# Patient Record
Sex: Male | Born: 1960 | Race: White | Hispanic: No | Marital: Married | Smoking: Never smoker
Health system: Southern US, Community
[De-identification: ages and names within clinical notes are randomized; demographics above are authoritative.]

## PROBLEM LIST (undated history)

## (undated) DIAGNOSIS — E119 Type 2 diabetes mellitus without complications: Secondary | ICD-10-CM

## (undated) DIAGNOSIS — E785 Hyperlipidemia, unspecified: Secondary | ICD-10-CM

## (undated) DIAGNOSIS — G5603 Carpal tunnel syndrome, bilateral upper limbs: Secondary | ICD-10-CM

## (undated) DIAGNOSIS — I10 Essential (primary) hypertension: Secondary | ICD-10-CM

## (undated) HISTORY — PX: TONSILLECTOMY: SUR1361

## (undated) HISTORY — DX: Carpal tunnel syndrome, bilateral upper limbs: G56.03

## (undated) HISTORY — DX: Hyperlipidemia, unspecified: E78.5

## (undated) HISTORY — DX: Essential (primary) hypertension: I10

---

## 2012-03-09 ENCOUNTER — Encounter (HOSPITAL_BASED_OUTPATIENT_CLINIC_OR_DEPARTMENT_OTHER): Payer: Self-pay | Admitting: *Deleted

## 2012-03-09 ENCOUNTER — Emergency Department (HOSPITAL_BASED_OUTPATIENT_CLINIC_OR_DEPARTMENT_OTHER)
Admission: EM | Admit: 2012-03-09 | Discharge: 2012-03-10 | Disposition: A | Discharge status: Home / Self Care | Payer: BC Managed Care – PPO | Attending: Emergency Medicine | Admitting: Emergency Medicine

## 2012-03-09 DIAGNOSIS — IMO0001 Reserved for inherently not codable concepts without codable children: Secondary | ICD-10-CM | POA: Insufficient documentation

## 2012-03-09 DIAGNOSIS — Z79899 Other long term (current) drug therapy: Secondary | ICD-10-CM | POA: Insufficient documentation

## 2012-03-09 DIAGNOSIS — E119 Type 2 diabetes mellitus without complications: Secondary | ICD-10-CM | POA: Insufficient documentation

## 2012-03-09 DIAGNOSIS — J111 Influenza due to unidentified influenza virus with other respiratory manifestations: Secondary | ICD-10-CM

## 2012-03-09 DIAGNOSIS — R05 Cough: Secondary | ICD-10-CM | POA: Insufficient documentation

## 2012-03-09 DIAGNOSIS — R059 Cough, unspecified: Secondary | ICD-10-CM | POA: Insufficient documentation

## 2012-03-09 HISTORY — DX: Type 2 diabetes mellitus without complications: E11.9

## 2012-03-09 LAB — URINE MICROSCOPIC-ADD ON

## 2012-03-09 LAB — URINALYSIS, ROUTINE W REFLEX MICROSCOPIC
Bilirubin Urine: NEGATIVE
Glucose, UA: NEGATIVE mg/dL
Hgb urine dipstick: NEGATIVE
Ketones, ur: NEGATIVE mg/dL
Leukocytes, UA: NEGATIVE
Nitrite: NEGATIVE
Protein, ur: 30 mg/dL — AB
Specific Gravity, Urine: 1.028 (ref 1.005–1.030)
Urobilinogen, UA: 1 mg/dL (ref 0.0–1.0)
pH: 5.5 (ref 5.0–8.0)

## 2012-03-09 MED ORDER — HYDROCOD POLST-CHLORPHEN POLST 10-8 MG/5ML PO LQCR: 5.0000 mL | Freq: Two times a day (BID) | ORAL | Status: DC | PRN

## 2012-03-09 MED ORDER — OSELTAMIVIR PHOSPHATE 75 MG PO CAPS: 75.0000 mg | ORAL_CAPSULE | Freq: Two times a day (BID) | ORAL | Status: DC

## 2012-03-09 NOTE — ED Notes (Addendum)
Cough, congestion, fever onset Thursday. Wife here with similar s/s. Took Ibuprofen 600 mg 1 hour ago. Also c/o pressure and frequency with urination

## 2012-03-09 NOTE — ED Provider Notes (Signed)
History  This chart was scribed for Hanley Seamen, MD by Shari Heritage, ED Scribe. The patient was seen in room MH04/MH04. Patient's care was started at 2334.  CSN: [id number]  Arrival date & time 03/09/12  2231   First MD Initiated Contact with Patient 03/09/12 2334      Chief Complaint  Patient presents with  . Flu-Like Symptoms     The history is provided by the patient. No language interpreter was used.    HPI Comments: Jon Archer is a 52 y.o. male with history of diabetes who presents to the Emergency Department complaining of fever, chills and body aches that began in the last 1-2 days. Tmax at home was 102.4. There is associated neck and lower back soreness, sore throat, cough and intermittent congestion. Patient denies nausea, vomiting, diarrhea or shortness of breath. Patient also complains of bladder pressure and increased urinary frequency. Patient says that he is experiencing the most discomfort from fever and back aches. Patient's wife also here with flu-like symptoms. Patient did not have a flu shot this season. He does not smoke.  Past Medical History  Diagnosis Date  . Diabetes mellitus without complication     History reviewed. No pertinent past surgical history.  History reviewed. No pertinent family history.  History  Substance Use Topics  . Smoking status: Never Smoker   . Smokeless tobacco: Not on file  . Alcohol Use: No      Review of Systems A complete 10 system review of systems was obtained and all systems are negative except as noted in the HPI and PMH.   Allergies  Peanuts  Home Medications   Current Outpatient Rx  Name  Route  Sig  Dispense  Refill  . GLYBURIDE 2.5 MG PO TABS   Oral   Take 2.5 mg by mouth daily with breakfast.         . METFORMIN HCL 1000 MG PO TABS   Oral   Take 1,000 mg by mouth 2 (two) times daily with a meal.           Triage Vitals: BP 141/89  Pulse 108  Temp 101.8 F (38.8 C) (Oral)  Resp 20  Ht 5'  9.5" (1.765 m)  Wt 270 lb (122.471 kg)  BMI 39.30 kg/m2  SpO2 94%  Physical Exam  Constitutional: He is oriented to person, place, and time. He appears well-developed and well-nourished. No distress.  HENT:  Head: Normocephalic and atraumatic.  Mouth/Throat: Oropharynx is clear and moist and mucous membranes are normal. No oropharyngeal exudate or posterior oropharyngeal erythema.  Eyes: Conjunctivae normal and EOM are normal. Pupils are equal, round, and reactive to light.  Neck: Neck supple.  Cardiovascular: Normal rate and regular rhythm.   No murmur heard. Pulmonary/Chest: Effort normal and breath sounds normal. No respiratory distress. He has no wheezes. He has no rales.       Occasional coughs.  Abdominal: Soft. Bowel sounds are normal.  Musculoskeletal: Normal range of motion. He exhibits no edema.       Normal pulses. No edema.  Neurological: He is alert and oriented to person, place, and time.  Skin: Skin is warm and dry.  Psychiatric: He has a normal mood and affect. His behavior is normal.    ED Course  Procedures (including critical care time) DIAGNOSTIC STUDIES: Oxygen Saturation is 94% on room air, adequate by my interpretation.    COORDINATION OF CARE: 11:38 PM- Patient informed of current plan for treatment and  evaluation and agrees with plan at this time.     MDM   Nursing notes and vitals signs, including pulse oximetry, reviewed.  Summary of this visit's results, reviewed by myself:  Labs:  Results for orders placed during the hospital encounter of 03/09/12 (from the past 24 hour(s))  URINALYSIS, ROUTINE W REFLEX MICROSCOPIC     Status: Abnormal   Collection Time   03/09/12 10:47 PM      Component Value Range   Color, Urine YELLOW  YELLOW   APPearance CLEAR  CLEAR   Specific Gravity, Urine 1.028  1.005 - 1.030   pH 5.5  5.0 - 8.0   Glucose, UA NEGATIVE  NEGATIVE mg/dL   Hgb urine dipstick NEGATIVE  NEGATIVE   Bilirubin Urine NEGATIVE  NEGATIVE    Ketones, ur NEGATIVE  NEGATIVE mg/dL   Protein, ur 30 (*) NEGATIVE mg/dL   Urobilinogen, UA 1.0  0.0 - 1.0 mg/dL   Nitrite NEGATIVE  NEGATIVE   Leukocytes, UA NEGATIVE  NEGATIVE  URINE MICROSCOPIC-ADD ON     Status: Abnormal   Collection Time   03/09/12 10:47 PM      Component Value Range   Squamous Epithelial / LPF FEW (*) RARE   WBC, UA 0-2  <3 WBC/hpf   RBC / HPF 0-2  <3 RBC/hpf   Casts GRANULAR CAST (*) NEGATIVE   Urine-Other MUCOUS PRESENT              I personally performed the services described in this documentation, which was scribed in my presence.  The recorded information has been reviewed and is accurate.Carlisle Beers Shalene Gallen, MD 03/09/12 [phone]

## 2012-04-12 ENCOUNTER — Emergency Department (HOSPITAL_BASED_OUTPATIENT_CLINIC_OR_DEPARTMENT_OTHER)
Admission: EM | Admit: 2012-04-12 | Discharge: 2012-04-12 | Disposition: A | Discharge status: Home / Self Care | Payer: BC Managed Care – PPO | Attending: Emergency Medicine | Admitting: Emergency Medicine

## 2012-04-12 ENCOUNTER — Encounter (HOSPITAL_BASED_OUTPATIENT_CLINIC_OR_DEPARTMENT_OTHER): Payer: Self-pay | Admitting: *Deleted

## 2012-04-12 ENCOUNTER — Emergency Department (HOSPITAL_BASED_OUTPATIENT_CLINIC_OR_DEPARTMENT_OTHER): Payer: BC Managed Care – PPO

## 2012-04-12 DIAGNOSIS — S9030XA Contusion of unspecified foot, initial encounter: Secondary | ICD-10-CM | POA: Insufficient documentation

## 2012-04-12 DIAGNOSIS — E119 Type 2 diabetes mellitus without complications: Secondary | ICD-10-CM | POA: Insufficient documentation

## 2012-04-12 DIAGNOSIS — W1789XA Other fall from one level to another, initial encounter: Secondary | ICD-10-CM | POA: Insufficient documentation

## 2012-04-12 DIAGNOSIS — Y9389 Activity, other specified: Secondary | ICD-10-CM | POA: Insufficient documentation

## 2012-04-12 DIAGNOSIS — S300XXA Contusion of lower back and pelvis, initial encounter: Secondary | ICD-10-CM | POA: Insufficient documentation

## 2012-04-12 DIAGNOSIS — Z79899 Other long term (current) drug therapy: Secondary | ICD-10-CM | POA: Insufficient documentation

## 2012-04-12 DIAGNOSIS — Y9289 Other specified places as the place of occurrence of the external cause: Secondary | ICD-10-CM | POA: Insufficient documentation

## 2012-04-12 MED ORDER — HYDROCODONE-ACETAMINOPHEN 5-325 MG PO TABS: 2.0000 | ORAL_TABLET | Freq: Four times a day (QID) | ORAL | Status: DC | PRN

## 2012-04-12 NOTE — ED Notes (Signed)
Left hand and elbow appear to be swelling

## 2012-04-12 NOTE — ED Notes (Signed)
Fell through ceiling to floor about 12 feet states he landed on left foot no loss of consciousness tail bone hurts low back spasms

## 2012-04-12 NOTE — ED Notes (Signed)
Pt also reports sacral pain that increases with movement.  Pt states he fell through a ceiling approx 12 feet, landing on left foot and back.  Denies head injury, LOC or neck pain. MAE WNL.  Abrasions noted to upper extremities.

## 2012-04-12 NOTE — ED Provider Notes (Signed)
History     CSN: [id number]  Arrival date & time 04/12/12  1725   First MD Initiated Contact with Patient 04/12/12 1931      Chief Complaint  Patient presents with  . Foot Pain  . Fall    (Consider location/radiation/quality/duration/timing/severity/associated sxs/prior treatment) Patient is a 52 y.o. male presenting with lower extremity pain and fall.  Foot Pain  Fall   Pt was working in a crawl space today when he fell through the ceiling and onto the floor below landing on his L foot and then on his buttocks. Complaining of moderate to severe aching pain in L foot, worse with movement and bearing weight. He also has moderate aching sacral pain. Denies any head injury or LOC.   Past Medical History  Diagnosis Date  . Diabetes mellitus without complication     History reviewed. No pertinent past surgical history.  History reviewed. No pertinent family history.  History  Substance Use Topics  . Smoking status: Never Smoker   . Smokeless tobacco: Not on file  . Alcohol Use: No      Review of Systems All other systems reviewed and are negative except as noted in HPI.   Allergies  Peanuts  Home Medications   Current Outpatient Rx  Name  Route  Sig  Dispense  Refill  . chlorpheniramine-HYDROcodone (TUSSIONEX PENNKINETIC ER) 10-8 MG/5ML LQCR   Oral   Take 5 mLs by mouth every 12 (twelve) hours as needed (for cough).   115 mL   0   . glyBURIDE (DIABETA) 2.5 MG tablet   Oral   Take 2.5 mg by mouth daily with breakfast.         . metFORMIN (GLUCOPHAGE) 1000 MG tablet   Oral   Take 1,000 mg by mouth 2 (two) times daily with a meal.         . oseltamivir (TAMIFLU) 75 MG capsule   Oral   Take 1 capsule (75 mg total) by mouth every 12 (twelve) hours.   10 capsule   0     BP 151/90  Pulse 98  Temp(Src) 98.1 F (36.7 C) (Oral)  Resp 18  Ht 5' 9.5" (1.765 m)  Wt 275 lb (124.739 kg)  BMI 40.04 kg/m2  SpO2 100%  Physical Exam  Nursing note and  vitals reviewed. Constitutional: He is oriented to person, place, and time. He appears well-developed and well-nourished.  HENT:  Head: Normocephalic and atraumatic.  Eyes: EOM are normal. Pupils are equal, round, and reactive to light.  Neck: Normal range of motion. Neck supple.  Cardiovascular: Normal rate, normal heart sounds and intact distal pulses.   Pulmonary/Chest: Effort normal and breath sounds normal.  Abdominal: Bowel sounds are normal. He exhibits no distension. There is no tenderness.  Musculoskeletal: He exhibits tenderness (tenderness and ecchymosis to L foot distal to lateral malleolus and on the plantar surface, no bony tenderness). He exhibits no edema.  Moderate sacral tenderness  Neurological: He is alert and oriented to person, place, and time. He has normal strength. No cranial nerve deficit or sensory deficit.  Skin: Skin is warm and dry. No rash noted.  Psychiatric: He has a normal mood and affect.    ED Course  Procedures (including critical care time)  Labs Reviewed - No data to display Dg Lumbar Spine Complete  04/12/2012  *RADIOLOGY REPORT*  Clinical Data: Fall, low back pain.  LUMBAR SPINE - COMPLETE 4+ VIEW  Comparison: None.  Findings: Degenerative disc and facet  disease throughout the lumbar spine, most pronounced at the lumbosacral junction.  Severe bony overgrowth and L5-S1 facets.  Normal alignment.  SI joints are symmetric and unremarkable.  IMPRESSION: Degenerative disc and facet disease, most pronounced in the lower lumbar spine.  No acute findings.   Original Report Authenticated By: Charlett Nose, M.D.    Dg Foot Complete Left  04/12/2012  *RADIOLOGY REPORT*  Clinical Data: Fall, foot pain.  LEFT FOOT - COMPLETE 3+ VIEW  Comparison: None.  Findings: Plantar calcaneal spur. No acute bony abnormality. Specifically, no fracture, subluxation, or dislocation.  Soft tissues are intact.  Degenerative changes in the left ankle.  IMPRESSION: No acute bony  abnormality.   Original Report Authenticated By: Charlett Nose, M.D.      No diagnosis found.    MDM  Xrays neg for fracture. Will give Cam Walker, crutches, pain medications as needed and advised to stay off foot to facilitate healing.         Jamieson Hetland B. Bernette Mayers, MD 04/12/12 2030

## 2012-04-12 NOTE — Discharge Instructions (Signed)

## 2015-11-23 ENCOUNTER — Telehealth: Payer: Self-pay | Admitting: Behavioral Health

## 2015-11-23 NOTE — Telephone Encounter (Signed)
Unable to reach patient at time of Pre-Visit Call.  Left message for patient to return call when available.    

## 2015-11-24 ENCOUNTER — Ambulatory Visit (INDEPENDENT_AMBULATORY_CARE_PROVIDER_SITE_OTHER): Payer: BLUE CROSS/BLUE SHIELD | Admitting: Family Medicine

## 2015-11-24 ENCOUNTER — Encounter: Payer: Self-pay | Admitting: Family Medicine

## 2015-11-24 VITALS — BP 153/93 | HR 80 | Temp 98.6°F | Ht 69.5 in | Wt 268.2 lb

## 2015-11-24 DIAGNOSIS — Z13220 Encounter for screening for lipoid disorders: Secondary | ICD-10-CM | POA: Diagnosis not present

## 2015-11-24 DIAGNOSIS — I10 Essential (primary) hypertension: Secondary | ICD-10-CM | POA: Diagnosis not present

## 2015-11-24 DIAGNOSIS — Z130 Encounter for screening for diseases of the blood and blood-forming organs and certain disorders involving the immune mechanism: Secondary | ICD-10-CM

## 2015-11-24 DIAGNOSIS — E669 Obesity, unspecified: Secondary | ICD-10-CM

## 2015-11-24 DIAGNOSIS — E119 Type 2 diabetes mellitus without complications: Secondary | ICD-10-CM

## 2015-11-24 DIAGNOSIS — J989 Respiratory disorder, unspecified: Secondary | ICD-10-CM

## 2015-11-24 DIAGNOSIS — R0989 Other specified symptoms and signs involving the circulatory and respiratory systems: Secondary | ICD-10-CM

## 2015-11-24 DIAGNOSIS — G5603 Carpal tunnel syndrome, bilateral upper limbs: Secondary | ICD-10-CM

## 2015-11-24 LAB — COMPREHENSIVE METABOLIC PANEL
ALT: 31 U/L (ref 0–53)
AST: 22 U/L (ref 0–37)
Albumin: 4.2 g/dL (ref 3.5–5.2)
Alkaline Phosphatase: 94 U/L (ref 39–117)
BUN: 20 mg/dL (ref 6–23)
CO2: 30 mEq/L (ref 19–32)
Calcium: 9.2 mg/dL (ref 8.4–10.5)
Chloride: 102 mEq/L (ref 96–112)
Creatinine, Ser: 0.8 mg/dL (ref 0.40–1.50)
GFR: 106.62 mL/min (ref >60.00)
Glucose, Bld: 171 mg/dL — ABNORMAL HIGH (ref 70–99)
Potassium: 4.1 mEq/L (ref 3.5–5.1)
Sodium: 139 mEq/L (ref 135–145)
Total Bilirubin: 0.5 mg/dL (ref 0.2–1.2)
Total Protein: 6.8 g/dL (ref 6.0–8.3)

## 2015-11-24 LAB — LIPID PANEL
Cholesterol: 202 mg/dL — ABNORMAL HIGH (ref 0–200)
HDL: 37.6 mg/dL — ABNORMAL LOW (ref >39.00)
LDL Cholesterol: 140 mg/dL — ABNORMAL HIGH (ref 0–99)
NonHDL: 164.52
Total CHOL/HDL Ratio: 5
Triglycerides: 125 mg/dL (ref 0.0–149.0)
VLDL: 25 mg/dL (ref 0.0–40.0)

## 2015-11-24 LAB — CBC
HCT: 44.6 % (ref 39.0–52.0)
Hemoglobin: 15.3 g/dL (ref 13.0–17.0)
MCHC: 34.3 g/dL (ref 30.0–36.0)
MCV: 79.8 fl (ref 78.0–100.0)
Platelets: 203 10*3/uL (ref 150.0–400.0)
RBC: 5.59 Mil/uL (ref 4.22–5.81)
RDW: 13.9 % (ref 11.5–15.5)
WBC: 7.5 10*3/uL (ref 4.0–10.5)

## 2015-11-24 LAB — HEMOGLOBIN A1C: Hgb A1c MFr Bld: 8.5 % — ABNORMAL HIGH (ref 4.6–6.5)

## 2015-11-24 MED ORDER — LISINOPRIL 10 MG PO TABS: 10.0000 mg | ORAL_TABLET | Freq: Every day | ORAL | 3 refills | Status: DC

## 2015-11-24 MED ORDER — METFORMIN HCL 1000 MG PO TABS: 1000.0000 mg | ORAL_TABLET | Freq: Two times a day (BID) | ORAL | 3 refills | Status: DC

## 2015-11-24 MED ORDER — ALBUTEROL SULFATE HFA 108 (90 BASE) MCG/ACT IN AERS: 2.0000 | INHALATION_SPRAY | Freq: Four times a day (QID) | RESPIRATORY_TRACT | 0 refills | Status: DC | PRN

## 2015-11-24 MED ORDER — GLIMEPIRIDE 2 MG PO TABS: 2.0000 mg | ORAL_TABLET | Freq: Two times a day (BID) | ORAL | 3 refills | Status: DC

## 2015-11-24 NOTE — Progress Notes (Addendum)
[facility] [address], [address] [place], [place] [address] [phone]  Date:  11/24/2015   Name:  Jon Archer   DOB:  December 04, 1960   MRN:  [id number]  PCP:  Abbe Amsterdam, MD    Chief Complaint: Establish Care (Pt here to est care and will need med refills.)   History of Present Illness:  Jon Archer is a 55 y.o. very pleasant male patient who presents with the following:  Here today as a new patient to establish care and have medication refills  History of DM II on oral medication  He is feeling well today He was dx with DM approx 3-4 years ago.  He uses metformin and also glimepiride.  His last A1c was over a year ago.  Admits he has not kept up with screening as well as he should   He has not wanted to take BP meds in the past.  He will check his BP at home- it may run 150s/ 80-90.  He is realizing he probably does need to start BP medication  He does AV installations, mostly commercial.    He is married, they have one step son who is grown and independent. He does not have a lot of free time, he does work a lot.    He did have a right knee meniscal tear but did not have surgery- it does not generally bother him that much currently and he is not considering surgery at this time.    He will sometimes notice mild wheezing when he is exposed to allergens or change in air temperature.  Never formally dx with asthma.  No exercise sx, no chest pain or heaviness with exercise.  He uses OTC zyrtec daily  Colonoscopy UTD, 2014, done through [facility]  He declines PSA testing today He declies a flu shot today  He has been told he might have CTS in the past.  The sx will travel between both hands. He does have braces for sleep, but they are somewhat umcomfortable so he does not always use them.  However they do seem to help when he does use them.  He will notice tingling, some weakness in the index and long fingers.  Can be worse in the mornings. No other neurological sx.  Never had nerve conduction studies but he does not want to have these as he is not at the point of considering surgery at this time   BP Readings from Last 3 Encounters:  11/24/15 (!) 153/93  04/12/12 151/90  03/09/12 141/89    There are no active problems to display for this patient.   Past Medical History:  Diagnosis Date  . Diabetes mellitus without complication     No past surgical history on file.  Social History  Substance Use Topics  . Smoking status: Never Smoker  . Smokeless tobacco: Not on file  . Alcohol use No    No family history on file.  Allergies  Allergen Reactions  . Peanuts [Peanut Oil] Itching    Medication list has been reviewed and updated.  Current Outpatient Prescriptions on File Prior to Visit  Medication Sig Dispense Refill  . metFORMIN (GLUCOPHAGE) 1000 MG tablet Take 1,000 mg by mouth 2 (two) times daily with a meal.     No current facility-administered medications on file prior to visit.     Review of Systems:  As per HPI- otherwise negative. No fever, chills, nausea, vomiting, or diarrhea,  no rash   Physical Examination: Blood pressure (!) 153/93, pulse 80, temperature 98.6 F (37 C), temperature source Oral, height 5' 9.5" (1.765 m), weight 268 lb 3.2 oz (121.7 kg), SpO2 100 %. Body mass index is 39.04 kg/m.  Ideal Body Weight:    GEN: WDWN, NAD, Non-toxic, A & O x 3, obese, otherwise looks well HEENT: Atraumatic, Normocephalic. Neck supple. No masses, No LAD.  Bilateral TM wnl, oropharynx normal.  PEERL,EOMI.   Ears and Nose: No external deformity. CV: RRR, No M/G/R. No JVD. No thrill. No extra heart sounds. PULM: CTA B, no wheezes, crackles, rhonchi. No retractions. No resp. distress. No accessory muscle use. ABD: S, NT, ND EXTR: No c/c/e NEURO Normal gait.  PSYCH: Normally interactive. Conversant. Not depressed or anxious appearing.  Calm demeanor.  Not able to  reproduce his CTS symptoms on exam   Assessment and Plan: Controlled type 2 diabetes mellitus without complication, without long-term current use of insulin (HCC) - Plan: glimepiride (AMARYL) 2 MG tablet, metFORMIN (GLUCOPHAGE) 1000 MG tablet, Comprehensive metabolic panel, Hemoglobin A1c, Urine Microalbumin w/creat. ratio  Essential hypertension, benign - Plan: lisinopril (PRINIVIL,ZESTRIL) 10 MG tablet, Comprehensive metabolic panel  Screening for deficiency anemia - Plan: CBC  Screening for hyperlipidemia - Plan: Lipid panel  Reactive airway disease that is not asthma - Plan: albuterol (PROVENTIL HFA;VENTOLIN HFA) 108 (90 Base) MCG/ACT inhaler  Bilateral carpal tunnel syndrome  Obesity, unspecified  Here today to establish care Refilled his medications as above Await labs to eval his current DM control Encouraged him to use his wrist braces for CTS symptoms rx for albuterol to use as needed for RAD symptoms.  Will plan further follow- up pending labs. Start lisinopril for his BP   Signed Abbe AmsterdamJessica Kainen Struckman, MD  Received his labs on 9/28- gave him a call.  He is not willing to use a statin.  In that case encouraged him to try and increase his good fats and use a fish oil/ omega 3 supplement to boost HDL.  A1c is also too high but we will work on lifestyle and diet prior to changing meds.  Recheck 3-4 months   Results for orders placed or performed in visit on 11/24/15  CBC  Result Value Ref Range   WBC 7.5 4.0 - 10.5 K/uL   RBC 5.59 4.22 - 5.81 Mil/uL   Platelets 203.0 150.0 - 400.0 K/uL   Hemoglobin 15.3 13.0 - 17.0 g/dL   HCT 40.944.6 81.139.0 - 91.452.0 %   MCV 79.8 78.0 - 100.0 fl   MCHC 34.3 30.0 - 36.0 g/dL   RDW 78.213.9 95.611.5 - 21.315.5 %  Comprehensive metabolic panel  Result Value Ref Range   Sodium 139 135 - 145 mEq/L   Potassium 4.1 3.5 - 5.1 mEq/L   Chloride 102 96 - 112 mEq/L   CO2 30 19 - 32 mEq/L   Glucose, Bld 171 (H) 70 - 99 mg/dL   BUN 20 6 - 23 mg/dL   Creatinine, Ser  0.860.80 0.40 - 1.50 mg/dL   Total Bilirubin 0.5 0.2 - 1.2 mg/dL   Alkaline Phosphatase 94 39 - 117 U/L   AST 22 0 - 37 U/L   ALT 31 0 - 53 U/L   Total Protein 6.8 6.0 - 8.3 g/dL   Albumin 4.2 3.5 - 5.2 g/dL   Calcium 9.2 8.4 - 57.810.5 mg/dL   GFR 469.62106.62 >95.28>60.00 mL/min  Lipid panel  Result Value Ref Range   Cholesterol 202 (H) 0 -  200 mg/dL   Triglycerides 161.0 0.0 - 149.0 mg/dL   HDL 96.04 (L) >54.09 mg/dL   VLDL 81.1 0.0 - 91.4 mg/dL   LDL Cholesterol 782 (H) 0 - 99 mg/dL   Total CHOL/HDL Ratio 5    NonHDL 164.52   Hemoglobin A1c  Result Value Ref Range   Hgb A1c MFr Bld 8.5 (H) 4.6 - 6.5 %  Urine Microalbumin w/creat. ratio  Result Value Ref Range   Microalb, Ur 1.1 0.0 - 1.9 mg/dL   Creatinine,U 956.2 mg/dL   Microalb Creat Ratio 0.9 0.0 - 30.0 mg/g

## 2015-11-24 NOTE — Progress Notes (Signed)
Pre visit review using our clinic review tool, if applicable. No additional management support is needed unless otherwise documented below in the visit note. 

## 2015-11-24 NOTE — Patient Instructions (Addendum)
It was very nice to meet you today- I'll be in touch with your labs and we can then plan your next visit Please start on the lisinopril 10 mg once a day;this will help with your blood pressure and also protect your kidneys from damage related to diabetes. We will also look for any protein in your urine- this can be a sign of diabetes related damage It does sound like you have carpal tunnel syndrome.  For definitive diagnosis we would need to nerve conduction studies.  However, if you are not considering surgery there is no acute need to do the nerve studies.  Try to wear your wrist braces at night to off load the median nerve and help with your hand symptoms.

## 2015-11-25 ENCOUNTER — Encounter: Payer: Self-pay | Admitting: Family Medicine

## 2015-11-25 LAB — MICROALBUMIN / CREATININE URINE RATIO
Creatinine,U: 123.9 mg/dL
Microalb Creat Ratio: 0.9 mg/g (ref 0.0–30.0)
Microalb, Ur: 1.1 mg/dL (ref 0.0–1.9)

## 2016-02-15 ENCOUNTER — Encounter: Payer: Self-pay | Admitting: Internal Medicine

## 2016-02-15 ENCOUNTER — Other Ambulatory Visit: Payer: Self-pay | Admitting: Family Medicine

## 2016-02-15 ENCOUNTER — Ambulatory Visit (INDEPENDENT_AMBULATORY_CARE_PROVIDER_SITE_OTHER): Payer: BLUE CROSS/BLUE SHIELD | Admitting: Internal Medicine

## 2016-02-15 VITALS — BP 132/84 | HR 72 | Temp 98.4°F | Resp 14 | Ht 69.5 in | Wt 272.0 lb

## 2016-02-15 DIAGNOSIS — J989 Respiratory disorder, unspecified: Secondary | ICD-10-CM

## 2016-02-15 DIAGNOSIS — J209 Acute bronchitis, unspecified: Secondary | ICD-10-CM | POA: Diagnosis not present

## 2016-02-15 DIAGNOSIS — R0989 Other specified symptoms and signs involving the circulatory and respiratory systems: Secondary | ICD-10-CM

## 2016-02-15 MED ORDER — AZITHROMYCIN 250 MG PO TABS: ORAL_TABLET | ORAL | 0 refills | Status: DC

## 2016-02-15 NOTE — Patient Instructions (Addendum)
Rest, fluids , tylenol  For cough:  Take Mucinex DM twice a day as needed until better  For nasal congestion: Use OTC Nasocort or Flonase : 2 nasal sprays on each side of the nose in the morning until you feel better   Avoid decongestants such as  Pseudoephedrine or phenylephrine   If you have a persisting cough: Use albuterol 2 sprays every 6 hours.  Take the antibiotic as prescribed  (Zithromax) only if not improving in the next few days  Call if not gradually better over the next  10 days  Call anytime if the symptoms are severe  If you have severe "chest tightness" or being in the chest: Call the office or go to the ER

## 2016-02-15 NOTE — Progress Notes (Signed)
Subjective:    Patient ID: Jon Archer, male    DOB: 03/24/1960, 55 y.o.   MRN: [id number]  DOS:  02/15/2016 Type of visit - description : acute Interval history: Symptoms started 2 weeks ago with sinus congestion, coughing in the mornings with mild sputum. Last night, had a severe episode of cough. Does not have asthma but apparently reactive airway disease, has a prescription for albuterol; has not been using it lately.  Review of Systems  Denies fever chills Some postnasal dripping and "tickle in the throat" that make him cough. No chest pain but since the symptoms started he has some chest tightness with exertion although he has hear no wheezing per se. No myalgias. No nausea or vomiting although last week had one episode of diarrhea.  Past Medical History:  Diagnosis Date  . Carpal tunnel syndrome, bilateral   . Diabetes mellitus without complication (HCC)   . Hyperlipidemia   . Hypertension     Past Surgical History:  Procedure Laterality Date  . TONSILLECTOMY      Social History   Social History  . Marital status: Married    Spouse name: N/A  . Number of children: N/A  . Years of education: N/A   Occupational History  . Not on file.   Social History Main Topics  . Smoking status: Never Smoker  . Smokeless tobacco: Not on file  . Alcohol use No  . Drug use: No  . Sexual activity: Not on file   Other Topics Concern  . Not on file   Social History Narrative  . No narrative on file      Allergies as of 02/15/2016      Reactions   Peanuts [peanut Oil] Itching   Meloxicam Nausea Only      Medication List       Accurate as of 02/15/16 11:59 PM. Always use your most recent med list.          albuterol 108 (90 Base) MCG/ACT inhaler Commonly known as:  PROVENTIL HFA;VENTOLIN HFA Inhale 2 puffs into the lungs every 6 (six) hours as needed for wheezing or shortness of breath.   aspirin 81 MG tablet Take 81 mg by mouth daily.     azithromycin 250 MG tablet Commonly known as:  ZITHROMAX Z-PAK 2 tabs a day the first day, then 1 tab a day x 4 days   cetirizine 10 MG tablet Commonly known as:  ZYRTEC Take 10 mg by mouth daily.   CINNAMON PO Take 2 tablets by mouth 2 (two) times daily.   glimepiride 2 MG tablet Commonly known as:  AMARYL Take 1 tablet (2 mg total) by mouth 2 (two) times daily.   GLUCOMANNAN PO Take 2 tablets by mouth 2 (two) times daily.   lisinopril 10 MG tablet Commonly known as:  PRINIVIL,ZESTRIL Take 1 tablet (10 mg total) by mouth daily.   metFORMIN 1000 MG tablet Commonly known as:  GLUCOPHAGE Take 1 tablet (1,000 mg total) by mouth 2 (two) times daily with a meal.   MUCUS RELIEF ER PO Take 1,200 mg by mouth 2 (two) times daily as needed.          Objective:   Physical Exam BP 132/84 (BP Location: Left Arm, Patient Position: Sitting, Cuff Size: Normal)   Pulse 72   Temp 98.4 F (36.9 C) (Oral)   Resp 14   Ht 5' 9.5" (1.765 m)   Wt 272 lb (123.4 kg)   SpO2 96%  BMI 39.59 kg/m  General:   Well developed, well nourished . NAD.  HEENT:  Normocephalic . Face symmetric, atraumatic. Nose is slightly congested, sinuses no TTP. Throat symmetric, no red. Lungs:  CTA B Normal respiratory effort, no intercostal retractions, no accessory muscle use. Heart: RRR,  no murmur.  No pretibial edema bilaterally  Skin: Not pale. Not jaundice Neurologic:  alert & oriented X3.  Speech normal, gait appropriate for age and unassisted Psych--  Cognition and judgment appear intact.  Cooperative with normal attention span and concentration.  Behavior appropriate. No anxious or depressed appearing.      Assessment & Plan:   55 year old gentleman with history of diabetes, not a smoker, no asthma but reactive airway disease (has a albuterol prescription) presents with the following: Bronchitis: 2 weeks history of respiratory symptoms, will treat conservatively, if not better start  Z-Pak. Chest tightness: As described above, in the context of bronchitis and upper respiratory symptoms not likely to be something serious like angina. Recommend to call go to the ER if he has severe tightness or actual chest pain.

## 2016-02-15 NOTE — Progress Notes (Signed)
Pre visit review using our clinic review tool, if applicable. No additional management support is needed unless otherwise documented below in the visit note. 

## 2016-02-16 ENCOUNTER — Other Ambulatory Visit: Payer: Self-pay | Admitting: Emergency Medicine

## 2016-02-17 ENCOUNTER — Other Ambulatory Visit: Payer: Self-pay | Admitting: Emergency Medicine

## 2016-02-17 DIAGNOSIS — R0989 Other specified symptoms and signs involving the circulatory and respiratory systems: Secondary | ICD-10-CM

## 2016-02-17 DIAGNOSIS — J989 Respiratory disorder, unspecified: Secondary | ICD-10-CM

## 2016-02-17 MED ORDER — ALBUTEROL SULFATE HFA 108 (90 BASE) MCG/ACT IN AERS: 2.0000 | INHALATION_SPRAY | Freq: Four times a day (QID) | RESPIRATORY_TRACT | 0 refills | Status: DC | PRN

## 2016-03-17 ENCOUNTER — Emergency Department (INDEPENDENT_AMBULATORY_CARE_PROVIDER_SITE_OTHER): Payer: BLUE CROSS/BLUE SHIELD

## 2016-03-17 ENCOUNTER — Encounter: Payer: Self-pay | Admitting: Emergency Medicine

## 2016-03-17 ENCOUNTER — Emergency Department (INDEPENDENT_AMBULATORY_CARE_PROVIDER_SITE_OTHER)
Admission: EM | Admit: 2016-03-17 | Discharge: 2016-03-17 | Disposition: A | Discharge status: Home / Self Care | Payer: BLUE CROSS/BLUE SHIELD | Source: Home / Self Care | Attending: Family Medicine | Admitting: Family Medicine

## 2016-03-17 DIAGNOSIS — J9811 Atelectasis: Secondary | ICD-10-CM

## 2016-03-17 DIAGNOSIS — J209 Acute bronchitis, unspecified: Secondary | ICD-10-CM | POA: Diagnosis not present

## 2016-03-17 DIAGNOSIS — J019 Acute sinusitis, unspecified: Secondary | ICD-10-CM

## 2016-03-17 MED ORDER — BENZONATATE 100 MG PO CAPS: 100.0000 mg | ORAL_CAPSULE | Freq: Three times a day (TID) | ORAL | 0 refills | Status: DC

## 2016-03-17 MED ORDER — PREDNISONE 20 MG PO TABS: ORAL_TABLET | ORAL | 0 refills | Status: DC

## 2016-03-17 MED ORDER — ALBUTEROL SULFATE HFA 108 (90 BASE) MCG/ACT IN AERS: 1.0000 | INHALATION_SPRAY | Freq: Four times a day (QID) | RESPIRATORY_TRACT | 0 refills | Status: DC | PRN

## 2016-03-17 MED ORDER — AZITHROMYCIN 250 MG PO TABS: 250.0000 mg | ORAL_TABLET | Freq: Every day | ORAL | 0 refills | Status: DC

## 2016-03-17 MED ORDER — AMOXICILLIN-POT CLAVULANATE 875-125 MG PO TABS: 1.0000 | ORAL_TABLET | Freq: Two times a day (BID) | ORAL | 0 refills | Status: DC

## 2016-03-17 NOTE — ED Provider Notes (Signed)
CSN: [id number]     Arrival date & time 03/17/16  1018 History   First MD Initiated Contact with Patient 03/17/16 1047     Chief Complaint  Patient presents with  . Cough   (Consider location/radiation/quality/duration/timing/severity/associated sxs/prior Treatment) HPI Jon Archer is a 56 y.o. male presenting to UC with c/o 4 days of gradually worsening productive cough with sinus pressure for 4 days.  Cough is moderately productive. Pt notes last night he was SOB while lying down so he had to sleep sitting up.  Denies dx of asthma but reports having a bad case of bronchitis or pneumonia about 4 years ago, which required him to be on multiple antibiotics and rounds of prednisone.  He has been using his inhaler more recently. Denies known fever. Denies n/v/d. No known sick contacts.    Past Medical History:  Diagnosis Date  . Carpal tunnel syndrome, bilateral   . Diabetes mellitus without complication (HCC)   . Hyperlipidemia   . Hypertension    Past Surgical History:  Procedure Laterality Date  . TONSILLECTOMY     History reviewed. No pertinent family history. Social History  Substance Use Topics  . Smoking status: Never Smoker  . Smokeless tobacco: Never Used  . Alcohol use No    Review of Systems  Constitutional: Negative for chills and fever.  HENT: Positive for congestion, postnasal drip, rhinorrhea, sinus pressure and sore throat. Negative for ear pain, trouble swallowing and voice change.   Respiratory: Positive for cough and shortness of breath.   Cardiovascular: Negative for chest pain and palpitations.  Gastrointestinal: Negative for abdominal pain, diarrhea, nausea and vomiting.  Musculoskeletal: Positive for arthralgias and myalgias. Negative for back pain.  Skin: Negative for rash.  Neurological: Negative for dizziness, light-headedness and headaches.    Allergies  Peanuts [peanut oil] and Meloxicam  Home Medications   Prior to Admission medications    Medication Sig Start Date End Date Taking? Authorizing Provider  albuterol (PROVENTIL HFA;VENTOLIN HFA) 108 (90 Base) MCG/ACT inhaler Inhale 2 puffs into the lungs every 6 (six) hours as needed for wheezing or shortness of breath. 02/17/16   Pearline CablesJessica C Copland, MD  amoxicillin-clavulanate (AUGMENTIN) 875-125 MG tablet Take 1 tablet by mouth 2 (two) times daily. One po bid x 10 days 03/17/16   Junius FinnerErin O'Malley, PA-C  aspirin 81 MG tablet Take 81 mg by mouth daily.    Historical Provider, MD  azithromycin (ZITHROMAX Z-PAK) 250 MG tablet 2 tabs a day the first day, then 1 tab a day x 4 days 02/15/16   Wanda PlumpJose E Paz, MD  benzonatate (TESSALON) 100 MG capsule Take 1-2 capsules (100-200 mg total) by mouth every 8 (eight) hours. 03/17/16   Junius FinnerErin O'Malley, PA-C  cetirizine (ZYRTEC) 10 MG tablet Take 10 mg by mouth daily.    Historical Provider, MD  CINNAMON PO Take 2 tablets by mouth 2 (two) times daily.    Historical Provider, MD  glimepiride (AMARYL) 2 MG tablet Take 1 tablet (2 mg total) by mouth 2 (two) times daily. 11/24/15   Gwenlyn FoundJessica C Copland, MD  GLUCOMANNAN PO Take 2 tablets by mouth 2 (two) times daily.    Historical Provider, MD  GuaiFENesin (MUCUS RELIEF ER PO) Take 1,200 mg by mouth 2 (two) times daily as needed.    Historical Provider, MD  lisinopril (PRINIVIL,ZESTRIL) 10 MG tablet Take 1 tablet (10 mg total) by mouth daily. 11/24/15   Pearline CablesJessica C Copland, MD  metFORMIN (GLUCOPHAGE) 1000 MG tablet Take  1 tablet (1,000 mg total) by mouth 2 (two) times daily with a meal. 11/24/15   Gwenlyn Found Copland, MD  predniSONE (DELTASONE) 20 MG tablet 3 tabs po day one, then 2 po daily x 4 days 03/17/16   Junius Finner, PA-C   Meds Ordered and Administered this Visit  Medications - No data to display  BP 175/93 (BP Location: Right Arm)   Pulse 82   Temp 97.8 F (36.6 C) (Oral)   Wt 278 lb (126.1 kg)   SpO2 97%   BMI 40.46 kg/m  No data found.   Physical Exam  Constitutional: He is oriented to person, place,  and time. He appears well-developed and well-nourished. No distress.  HENT:  Head: Normocephalic and atraumatic.  Right Ear: Tympanic membrane normal.  Left Ear: Tympanic membrane normal.  Nose: Mucosal edema present. Right sinus exhibits frontal sinus tenderness. Right sinus exhibits no maxillary sinus tenderness. Left sinus exhibits frontal sinus tenderness. Left sinus exhibits no maxillary sinus tenderness.  Mouth/Throat: Uvula is midline, oropharynx is clear and moist and mucous membranes are normal.  Eyes: EOM are normal.  Neck: Normal range of motion. Neck supple.  Cardiovascular: Normal rate and regular rhythm.   Pulmonary/Chest: Effort normal. No stridor. No respiratory distress. He has wheezes ( faint in upper and lower lung fields). He has no rales.  Musculoskeletal: Normal range of motion.  Lymphadenopathy:    He has no cervical adenopathy.  Neurological: He is alert and oriented to person, place, and time.  Skin: Skin is warm and dry. He is not diaphoretic.  Psychiatric: He has a normal mood and affect. His behavior is normal.  Nursing note and vitals reviewed.   Urgent Care Course     Procedures (including critical care time)  Labs Review Labs Reviewed - No data to display  Imaging Review Dg Chest 2 View  Result Date: 03/17/2016 CLINICAL DATA:  Cough. EXAM: CHEST  2 VIEW COMPARISON:  No recent prior . FINDINGS: Mediastinum hilar structures are normal. Mild bilateral peribronchial thickening noted consistent with bronchitis. No focal infiltrate. No pleural effusion or pneumothorax. Mild atelectatic changes right upper lobe. IMPRESSION: Mild bilateral peribronchial thickening noted consistent with bronchitis . Mild right upper lobe subsegmental atelectasis. Electronically Signed   By: Maisie Fus  Register   On: 03/17/2016 11:15     MDM   1. Acute bronchitis, unspecified organism   2. Acute rhinosinusitis    Pt c/o SOB with cough, congestion and sinus pressure for about  4 days.  Hx of bronchitis in the past.    CXR: c/w upper lobe subsegmental atelectasis and peribronchial thickening c/w bronchitis. Will start on antibiotic and treat for early sinusitis.  Rx: Augmentin, Prednisone, and tessalon Pt has albuterol inhaler at home.  F/u with PCP in 1 week if not improving. Patient verbalized understanding and agreement with treatment plan.     Junius Finner, PA-C 03/17/16 1156

## 2016-03-17 NOTE — ED Triage Notes (Signed)
Pt c/o cough with mucous and sinus pressure x4 days. Denies fever. Has been taking sudafed.

## 2016-03-17 NOTE — Discharge Instructions (Signed)
°  You can continue to take over the counter mucinex. Be sure to drink a large glass of water with each dose to help keep mucous thin and help you stay hydrated.  You may also use a humidifier in the evening and nasal saline to help keep nasal passages moist.   You may take 400-600mg  Ibuprofen (Motrin) every 6-8 hours for fever and pain  Alternate with Tylenol  You may take 500mg  Tylenol every 4-6 hours as needed for fever and pain  Follow-up with your primary care provider next week for recheck of symptoms if not improving.  Be sure to drink plenty of fluids and rest, at least 8hrs of sleep a night, preferably more while you are sick. Return urgent care or go to closest ER if you cannot keep down fluids/signs of dehydration, fever not reducing with Tylenol, difficulty breathing/wheezing, stiff neck, worsening condition, or other concerns (see below)  Please take antibiotics as prescribed and be sure to complete entire course even if you start to feel better to ensure infection does not come back.

## 2016-03-19 ENCOUNTER — Telehealth: Payer: Self-pay | Admitting: Emergency Medicine

## 2016-03-19 NOTE — Telephone Encounter (Signed)
Spoke with wife who states husband told her he is feeling better.

## 2016-04-13 ENCOUNTER — Emergency Department (HOSPITAL_BASED_OUTPATIENT_CLINIC_OR_DEPARTMENT_OTHER)
Admission: EM | Admit: 2016-04-13 | Discharge: 2016-04-13 | Disposition: A | Discharge status: Home / Self Care | Payer: BLUE CROSS/BLUE SHIELD | Attending: Emergency Medicine | Admitting: Emergency Medicine

## 2016-04-13 ENCOUNTER — Encounter (HOSPITAL_BASED_OUTPATIENT_CLINIC_OR_DEPARTMENT_OTHER): Payer: Self-pay

## 2016-04-13 ENCOUNTER — Emergency Department (HOSPITAL_BASED_OUTPATIENT_CLINIC_OR_DEPARTMENT_OTHER): Payer: BLUE CROSS/BLUE SHIELD

## 2016-04-13 DIAGNOSIS — Z79899 Other long term (current) drug therapy: Secondary | ICD-10-CM | POA: Insufficient documentation

## 2016-04-13 DIAGNOSIS — Y939 Activity, unspecified: Secondary | ICD-10-CM | POA: Diagnosis not present

## 2016-04-13 DIAGNOSIS — Y929 Unspecified place or not applicable: Secondary | ICD-10-CM | POA: Diagnosis not present

## 2016-04-13 DIAGNOSIS — S40021A Contusion of right upper arm, initial encounter: Secondary | ICD-10-CM | POA: Diagnosis not present

## 2016-04-13 DIAGNOSIS — W108XXA Fall (on) (from) other stairs and steps, initial encounter: Secondary | ICD-10-CM | POA: Insufficient documentation

## 2016-04-13 DIAGNOSIS — S4991XA Unspecified injury of right shoulder and upper arm, initial encounter: Secondary | ICD-10-CM | POA: Diagnosis present

## 2016-04-13 DIAGNOSIS — Z7982 Long term (current) use of aspirin: Secondary | ICD-10-CM | POA: Insufficient documentation

## 2016-04-13 DIAGNOSIS — Z7984 Long term (current) use of oral hypoglycemic drugs: Secondary | ICD-10-CM | POA: Diagnosis not present

## 2016-04-13 DIAGNOSIS — E119 Type 2 diabetes mellitus without complications: Secondary | ICD-10-CM | POA: Diagnosis not present

## 2016-04-13 DIAGNOSIS — Y999 Unspecified external cause status: Secondary | ICD-10-CM | POA: Insufficient documentation

## 2016-04-13 DIAGNOSIS — I10 Essential (primary) hypertension: Secondary | ICD-10-CM | POA: Insufficient documentation

## 2016-04-13 DIAGNOSIS — W19XXXA Unspecified fall, initial encounter: Secondary | ICD-10-CM

## 2016-04-13 NOTE — ED Triage Notes (Signed)
Slipped/fell down steps-pain to right UE-large amount of swelling noted to bicep/tricep area-NAD-steady gait

## 2016-04-13 NOTE — ED Provider Notes (Signed)
[facility] Provider Note   CSN: [id number] Arrival date & time: 04/13/16  1842  By signing my name below, I, Jon Archer, attest that this documentation has been prepared under the direction and in the presence of physician practitioner, Alvira MondayErin Henlee Donovan, MD. Electronically Signed: Linna Darnerussell Archer, Scribe. 04/13/2016. 8:09 PM.  History   Chief Complaint Chief Complaint  Patient presents with  . Arm Injury    The history is provided by the patient. No language interpreter was used.     HPI Comments: Jon Archer is a 56 y.o. male who presents to the Emergency Department complaining of an injury sustained to his right upper arm s/p falling down several steps from a height of ~ 5 feet shortly PTA. He states he landed on his right arm on concrete. He reports bruising, bleeding, and swelling to his right bicep/tricep area. He reports pain to the area that he rates as 1/10 at rest and is worse with applied pressure. Pt denies hitting his head or losing consciousness. He also reports some left hip pain since the fall but notes he is able to ambulate. He notes he is able to flex and extend his right arm normally. Pt takes a daily aspirin but denies other anticoagulant use. Tetanus status UTD. NKDA. Pt denies back pain, CP, SOB, abdominal pain, focal weakness, numbness/tingling, or any other associated symptoms.  Past Medical History:  Diagnosis Date  . Carpal tunnel syndrome, bilateral   . Diabetes mellitus without complication (HCC)   . Hyperlipidemia   . Hypertension     Patient Active Problem List   Diagnosis Date Noted  . Essential hypertension, benign 11/24/2015  . Controlled type 2 diabetes mellitus without complication, without long-term current use of insulin (HCC) 11/24/2015  . Bilateral carpal tunnel syndrome 11/24/2015  . Obesity, unspecified 11/24/2015    Past Surgical History:  Procedure Laterality Date  . TONSILLECTOMY         Home Medications     Prior to Admission medications   Medication Sig Start Date End Date Taking? Authorizing Provider  albuterol (PROVENTIL HFA;VENTOLIN HFA) 108 (90 Base) MCG/ACT inhaler Inhale 2 puffs into the lungs every 6 (six) hours as needed for wheezing or shortness of breath. 02/17/16   Pearline CablesJessica C Copland, MD  amoxicillin-clavulanate (AUGMENTIN) 875-125 MG tablet Take 1 tablet by mouth 2 (two) times daily. One po bid x 10 days 03/17/16   Junius FinnerErin O'Malley, PA-C  aspirin 81 MG tablet Take 81 mg by mouth daily.    Historical Provider, MD  azithromycin (ZITHROMAX Z-PAK) 250 MG tablet 2 tabs a day the first day, then 1 tab a day x 4 days 02/15/16   Wanda PlumpJose E Paz, MD  benzonatate (TESSALON) 100 MG capsule Take 1-2 capsules (100-200 mg total) by mouth every 8 (eight) hours. 03/17/16   Junius FinnerErin O'Malley, PA-C  cetirizine (ZYRTEC) 10 MG tablet Take 10 mg by mouth daily.    Historical Provider, MD  CINNAMON PO Take 2 tablets by mouth 2 (two) times daily.    Historical Provider, MD  glimepiride (AMARYL) 2 MG tablet Take 1 tablet (2 mg total) by mouth 2 (two) times daily. 11/24/15   Gwenlyn FoundJessica C Copland, MD  GLUCOMANNAN PO Take 2 tablets by mouth 2 (two) times daily.    Historical Provider, MD  GuaiFENesin (MUCUS RELIEF ER PO) Take 1,200 mg by mouth 2 (two) times daily as needed.    Historical Provider, MD  lisinopril (PRINIVIL,ZESTRIL) 10 MG tablet Take 1 tablet (10 mg  total) by mouth daily. 11/24/15   Pearline Cables, MD  metFORMIN (GLUCOPHAGE) 1000 MG tablet Take 1 tablet (1,000 mg total) by mouth 2 (two) times daily with a meal. 11/24/15   Gwenlyn Found Copland, MD  predniSONE (DELTASONE) 20 MG tablet 3 tabs po day one, then 2 po daily x 4 days 03/17/16   Junius Finner, PA-C    Family History No family history on file.  Social History Social History  Substance Use Topics  . Smoking status: Never Smoker  . Smokeless tobacco: Never Used  . Alcohol use No     Allergies   Peanuts [peanut oil] and Meloxicam   Review of  Systems Review of Systems  Constitutional: Negative for fever.  HENT: Negative for sore throat.   Eyes: Negative for visual disturbance.  Respiratory: Negative for shortness of breath.   Cardiovascular: Negative for chest pain.  Gastrointestinal: Negative for abdominal pain.  Genitourinary: Negative for difficulty urinating.  Musculoskeletal: Positive for joint swelling and myalgias. Negative for back pain and neck stiffness.  Skin: Positive for color change and wound. Negative for rash.  Neurological: Negative for syncope, weakness, numbness and headaches.     Physical Exam Updated Vital Signs BP (!) 178/102 (BP Location: Left Arm)   Pulse 78   Temp 98.1 F (36.7 C) (Oral)   Resp 20   Ht 5' 5.5" (1.664 m)   Wt 270 lb (122.5 kg)   SpO2 100%   BMI 44.25 kg/m   Physical Exam  Constitutional: He is oriented to person, place, and time. He appears well-developed and well-nourished. No distress.  HENT:  Head: Normocephalic and atraumatic.  Eyes: Conjunctivae and EOM are normal.  Neck: Normal range of motion. Neck supple. No tracheal deviation present.  Cardiovascular: Normal rate, regular rhythm, normal heart sounds and intact distal pulses.  Exam reveals no gallop and no friction rub.   No murmur heard. Pulmonary/Chest: Effort normal and breath sounds normal. No respiratory distress. He has no wheezes. He has no rales. He exhibits no tenderness.  Abdominal: Soft. He exhibits no distension. There is no tenderness. There is no guarding.  Musculoskeletal: Normal range of motion. He exhibits tenderness. He exhibits no edema.  Tenderness to the left lateral thigh. No contusion. Large contusion/hematoma right upper arm Normal ROM of arm  Neurological: He is alert and oriented to person, place, and time.  Skin: Skin is warm and dry. He is not diaphoretic.  Psychiatric: He has a normal mood and affect. His behavior is normal.  Nursing note and vitals reviewed.    ED Treatments /  Results  Labs (all labs ordered are listed, but only abnormal results are displayed) Labs Reviewed - No data to display  EKG  EKG Interpretation None       Radiology Dg Humerus Right  Result Date: 04/13/2016 CLINICAL DATA:  Injury. EXAM: RIGHT HUMERUS - 2+ VIEW COMPARISON:  None. FINDINGS: Soft tissue swelling about the medial lower right humerus without fracture or malalignment. IMPRESSION: Soft tissue swelling without osseous abnormality. Electronically Signed   By: Marnee Spring M.D.   On: 04/13/2016 19:28    Procedures Procedures (including critical care time)  DIAGNOSTIC STUDIES: Oxygen Saturation is 100% on RA, normal by my interpretation.    COORDINATION OF CARE: 8:18 PM Discussed treatment plan with pt at bedside and pt agreed to plan.  Medications Ordered in ED Medications - No data to display   Initial Impression / Assessment and Plan / ED Course  I  have reviewed the triage vital signs and the nursing notes.  Pertinent labs & imaging results that were available during my care of the patient were reviewed by me and considered in my medical decision making (see chart for details).     56yo male with history of ht, DM, hlpd presents with concern for fall about 5 feet from top of stairs with right arm pain.  XR of humerus without fracture.  Doubt biceps tendon rupture full ROM, no significant swelling.  No head trauma, no LOC, no cervical, thoracic, lumbar, chest wall or abdominal tenderness and doubt intracranial, spinal, intrathoracic or intraabdominal injury.  Patient discharged in stable condition with understanding of reasons to return.    Final Clinical Impressions(s) / ED Diagnoses   Final diagnoses:  Fall, initial encounter  Arm contusion, right, initial encounter    New Prescriptions Discharge Medication List as of 04/13/2016  8:21 PM     I personally performed the services described in this documentation, which was scribed in my presence. The  recorded information has been reviewed and is accurate.    Alvira Monday, MD 04/14/16 [phone]

## 2016-07-20 ENCOUNTER — Telehealth: Payer: Self-pay | Admitting: Family Medicine

## 2016-07-20 DIAGNOSIS — E119 Type 2 diabetes mellitus without complications: Secondary | ICD-10-CM

## 2016-07-20 NOTE — Telephone Encounter (Signed)
Fine with me [facility]

## 2016-07-20 NOTE — Telephone Encounter (Signed)
Caller name:Dawn Hollerbach(spouse) Relationship to patient: Can be [phone] Pharmacy:  Reason for call:[facility].  Also request referral to [facility], Dr Corwin LevinsWilliam Archer for his diabetes.

## 2016-07-24 NOTE — Telephone Encounter (Signed)
OK w me.  

## 2016-07-27 NOTE — Telephone Encounter (Signed)
Called patient, voice mail is full.

## 2016-09-08 NOTE — Telephone Encounter (Signed)
Pt's wife came into the office on today, pt has appointment to transfer to Dr. [facility] on 09/20/16 @7 :30, pt would like the referral to the endo Dr. Corwin LevinsWilliam Smith.

## 2016-09-11 NOTE — Addendum Note (Signed)
Addended by: Verdie ShireBAYNES, ANGELA M on: 09/11/2016 06:38 PM   Modules accepted: Orders

## 2016-09-11 NOTE — Telephone Encounter (Signed)
Referral placed and sent.//AB/CMA 

## 2016-09-11 NOTE — Telephone Encounter (Signed)
That's fine. Ty.  

## 2016-09-20 ENCOUNTER — Ambulatory Visit (INDEPENDENT_AMBULATORY_CARE_PROVIDER_SITE_OTHER): Payer: BLUE CROSS/BLUE SHIELD | Admitting: Family Medicine

## 2016-09-20 ENCOUNTER — Encounter: Payer: Self-pay | Admitting: Family Medicine

## 2016-09-20 VITALS — BP 138/80 | HR 69 | Temp 97.9°F | Ht 69.0 in | Wt 268.8 lb

## 2016-09-20 DIAGNOSIS — E119 Type 2 diabetes mellitus without complications: Secondary | ICD-10-CM

## 2016-09-20 DIAGNOSIS — J302 Other seasonal allergic rhinitis: Secondary | ICD-10-CM | POA: Diagnosis not present

## 2016-09-20 DIAGNOSIS — L309 Dermatitis, unspecified: Secondary | ICD-10-CM

## 2016-09-20 LAB — LIPID PANEL
Cholesterol: 214 mg/dL — ABNORMAL HIGH (ref 0–200)
HDL: 35.7 mg/dL — ABNORMAL LOW (ref >39.00)
LDL Cholesterol: 140 mg/dL — ABNORMAL HIGH (ref 0–99)
NonHDL: 177.84
Total CHOL/HDL Ratio: 6
Triglycerides: 190 mg/dL — ABNORMAL HIGH (ref 0.0–149.0)
VLDL: 38 mg/dL (ref 0.0–40.0)

## 2016-09-20 LAB — COMPREHENSIVE METABOLIC PANEL
ALT: 27 U/L (ref 0–53)
AST: 17 U/L (ref 0–37)
Albumin: 4 g/dL (ref 3.5–5.2)
Alkaline Phosphatase: 82 U/L (ref 39–117)
BUN: 20 mg/dL (ref 6–23)
CO2: 30 mEq/L (ref 19–32)
Calcium: 9.2 mg/dL (ref 8.4–10.5)
Chloride: 104 mEq/L (ref 96–112)
Creatinine, Ser: 0.9 mg/dL (ref 0.40–1.50)
GFR: 92.79 mL/min (ref >60.00)
Glucose, Bld: 199 mg/dL — ABNORMAL HIGH (ref 70–99)
Potassium: 3.9 mEq/L (ref 3.5–5.1)
Sodium: 140 mEq/L (ref 135–145)
Total Bilirubin: 0.5 mg/dL (ref 0.2–1.2)
Total Protein: 6.5 g/dL (ref 6.0–8.3)

## 2016-09-20 LAB — CBC
HCT: 42.2 % (ref 39.0–52.0)
Hemoglobin: 14.3 g/dL (ref 13.0–17.0)
MCHC: 34 g/dL (ref 30.0–36.0)
MCV: 82.1 fl (ref 78.0–100.0)
Platelets: 179 10*3/uL (ref 150.0–400.0)
RBC: 5.14 Mil/uL (ref 4.22–5.81)
RDW: 14.4 % (ref 11.5–15.5)
WBC: 6.9 10*3/uL (ref 4.0–10.5)

## 2016-09-20 LAB — HEMOGLOBIN A1C: Hgb A1c MFr Bld: 9.7 % — ABNORMAL HIGH (ref 4.6–6.5)

## 2016-09-20 MED ORDER — HYDROCORTISONE 2.5 % EX CREA: TOPICAL_CREAM | Freq: Two times a day (BID) | CUTANEOUS | 1 refills | Status: AC

## 2016-09-20 MED ORDER — LEVOCETIRIZINE DIHYDROCHLORIDE 5 MG PO TABS: 5.0000 mg | ORAL_TABLET | Freq: Every evening | ORAL | 5 refills | Status: DC

## 2016-09-20 NOTE — Patient Instructions (Signed)
If you don't have any desire to pursue further treatment or are doing better, cancel appt.  Let us know if you need anything.  Give us 2-3 business days to get the results of your labs back.

## 2016-09-20 NOTE — Progress Notes (Signed)
Chief Complaint  Patient presents with  . Establish Care    Transfer from Dr. Patsy Lageropland     Subjective: Patient is a 56 y.o. male here for transition of care.  Over the past 10 years, the patient has had rashes over the back of his thighs. It seems to calm down during the winter months, however when summer comes around, it starts bothering him. It will itch intermittently. Otherwise it does not bother him. He has been using over-the-counter steroid cream with some relief. No sick contacts, seasonal/scented topical product use, fevers, drainage, pain.  He has a history of diabetes and requested to see endocrinology. His next appointment is in September. He is currently on Amaryl and metformin. He has tried to go on Crestor the past, however both this and red yeast extract gave him muscle aches. He has never been on any other statin.   ROS: Heart: Denies chest pain  Lungs: Denies SOB  Skin: As noted in history of present illness  History reviewed. No pertinent family history. Past Medical History:  Diagnosis Date  . Carpal tunnel syndrome, bilateral   . Diabetes mellitus without complication (HCC)   . Hyperlipidemia   . Hypertension    Allergies  Allergen Reactions  . Peanuts [Peanut Oil] Itching  . Meloxicam Nausea Only    Current Outpatient Prescriptions:  .  aspirin 81 MG tablet, Take 81 mg by mouth daily., Disp: , Rfl:  .  CINNAMON PO, Take 2 tablets by mouth 2 (two) times daily., Disp: , Rfl:  .  fluticasone (FLONASE) 50 MCG/ACT nasal spray, Place 1 spray into both nostrils 2 (two) times daily., Disp: , Rfl:  .  glimepiride (AMARYL) 2 MG tablet, Take 1 tablet (2 mg total) by mouth 2 (two) times daily., Disp: 180 tablet, Rfl: 3 .  GLUCOMANNAN PO, Take 2 tablets by mouth 2 (two) times daily., Disp: , Rfl:  .  lisinopril (PRINIVIL,ZESTRIL) 10 MG tablet, Take 1 tablet (10 mg total) by mouth daily., Disp: 90 tablet, Rfl: 3 .  metFORMIN (GLUCOPHAGE) 1000 MG tablet, Take 1 tablet  (1,000 mg total) by mouth 2 (two) times daily with a meal., Disp: 180 tablet, Rfl: 3 .  hydrocortisone 2.5 % cream, Apply topically 2 (two) times daily., Disp: 30 g, Rfl: 1 .  levocetirizine (XYZAL) 5 MG tablet, Take 1 tablet (5 mg total) by mouth every evening., Disp: 30 tablet, Rfl: 5  Objective: BP 138/80 (BP Location: Left Arm, Patient Position: Sitting, Cuff Size: Large)   Pulse 69   Temp 97.9 F (36.6 C) (Oral)   Ht 5\' 9"  (1.753 m)   Wt 268 lb 12.8 oz (121.9 kg)   SpO2 99%   BMI 39.69 kg/m  General: Awake, appears stated age Heart: RRR, no murmurs, no LE edema Lungs: CTAB, no rales, wheezes or rhonchi. No accessory muscle use Skin: Discrete erythematous macules and papules over his posterior thighs bilaterally. There is no drainage, fluctuance, fluid-filled lesions, or streaking redness. There is no tenderness to palpation or scaling. Psych: Age appropriate judgment and insight, normal affect and mood  Assessment and Plan: Dermatitis - Plan: hydrocortisone 2.5 % cream  Seasonal allergic rhinitis, unspecified trigger - Plan: levocetirizine (XYZAL) 5 MG tablet  Diabetes mellitus type 2 without retinopathy (HCC) - Plan: CBC, Comprehensive metabolic panel, Hemoglobin A1c, Lipid panel  Orders as above. I question a heat rash given his history. Try prescription strength steroid cream. No improvement, return in 2 weeks and we'll consider biopsy versus referral  to dermatology. He has a history of allergies starting around this time of year due to ragweed. He has been taking Allegra and uses Flonase daily. He is wondering what else he can use. He has never used size all. His appointment with the endocrinologist is an September. I will obtain labs today. He has not eaten. He has been on Crestor and red yeast extract in the past and has found that it gives him muscle aches. He has never been on any other cholesterol lowering medication. I did discuss there are other types of statins and we  could try and even decreased dosing to once every 2-3 days. Will discuss again based off of his lab results, may have to defer to endocrinology. Follow-up in 2 weeks or as needed. The patient voiced understanding and agreement to the plan.  Jilda Rocheicholas Paul [facility], DO 09/20/16  9:07 AM

## 2016-11-06 ENCOUNTER — Other Ambulatory Visit: Payer: Self-pay | Admitting: Family Medicine

## 2016-11-06 DIAGNOSIS — I10 Essential (primary) hypertension: Secondary | ICD-10-CM

## 2016-12-09 ENCOUNTER — Other Ambulatory Visit: Payer: Self-pay | Admitting: Family Medicine

## 2016-12-09 DIAGNOSIS — E119 Type 2 diabetes mellitus without complications: Secondary | ICD-10-CM

## 2016-12-09 DIAGNOSIS — I10 Essential (primary) hypertension: Secondary | ICD-10-CM

## 2017-03-05 ENCOUNTER — Other Ambulatory Visit: Payer: Self-pay | Admitting: Family Medicine

## 2017-03-05 DIAGNOSIS — E119 Type 2 diabetes mellitus without complications: Secondary | ICD-10-CM

## 2017-03-26 ENCOUNTER — Other Ambulatory Visit: Payer: Self-pay | Admitting: Family Medicine

## 2017-03-26 DIAGNOSIS — J302 Other seasonal allergic rhinitis: Secondary | ICD-10-CM

## 2017-05-15 ENCOUNTER — Other Ambulatory Visit: Payer: Self-pay | Admitting: Family Medicine

## 2017-05-15 DIAGNOSIS — I10 Essential (primary) hypertension: Secondary | ICD-10-CM

## 2017-06-03 ENCOUNTER — Emergency Department (HOSPITAL_BASED_OUTPATIENT_CLINIC_OR_DEPARTMENT_OTHER)
Admission: EM | Admit: 2017-06-03 | Discharge: 2017-06-03 | Disposition: A | Discharge status: Home / Self Care | Payer: BLUE CROSS/BLUE SHIELD | Attending: Emergency Medicine | Admitting: Emergency Medicine

## 2017-06-03 ENCOUNTER — Emergency Department (HOSPITAL_BASED_OUTPATIENT_CLINIC_OR_DEPARTMENT_OTHER): Payer: BLUE CROSS/BLUE SHIELD

## 2017-06-03 ENCOUNTER — Other Ambulatory Visit: Payer: Self-pay

## 2017-06-03 ENCOUNTER — Encounter (HOSPITAL_BASED_OUTPATIENT_CLINIC_OR_DEPARTMENT_OTHER): Payer: Self-pay | Admitting: Adult Health

## 2017-06-03 DIAGNOSIS — Z7982 Long term (current) use of aspirin: Secondary | ICD-10-CM | POA: Insufficient documentation

## 2017-06-03 DIAGNOSIS — M25531 Pain in right wrist: Secondary | ICD-10-CM | POA: Insufficient documentation

## 2017-06-03 DIAGNOSIS — Z91010 Allergy to peanuts: Secondary | ICD-10-CM | POA: Diagnosis not present

## 2017-06-03 DIAGNOSIS — E119 Type 2 diabetes mellitus without complications: Secondary | ICD-10-CM | POA: Diagnosis not present

## 2017-06-03 DIAGNOSIS — I10 Essential (primary) hypertension: Secondary | ICD-10-CM | POA: Diagnosis not present

## 2017-06-03 DIAGNOSIS — Z7984 Long term (current) use of oral hypoglycemic drugs: Secondary | ICD-10-CM | POA: Insufficient documentation

## 2017-06-03 MED ORDER — ACETAMINOPHEN 325 MG PO TABS
650.0000 mg | ORAL_TABLET | Freq: Once | ORAL | Status: AC
  Administered 2017-06-03: 650 mg via ORAL
  Filled 2017-06-03: qty 2

## 2017-06-03 NOTE — ED Triage Notes (Signed)
Presents with right hand pain in the snuff box area that began today while dragging boxes. The pain is dependent on hand position and movement. IT does radiate into the wrist and mid forearm. And worse when had is prone.

## 2017-06-03 NOTE — Discharge Instructions (Signed)
You can take Tylenol or Ibuprofen as directed for pain. You can alternate Tylenol and Ibuprofen every 4 hours. If you take Tylenol at 1pm, then you can take Ibuprofen at 5pm. Then you can take Tylenol again at 9pm.   Follow the RICE (Rest, Ice, Compression, Elevation) protocol as directed.   Wear the splint for support and stabilization.  As we discussed, follow-up with referred outpatient orthopedic doctor the next 1-2 weeks if your symptoms do not improve.  Return the emergency department for any free pain, fever, redness or swelling of the wrist, numbness/weakness of the hands or any other worsening or concerning symptoms.

## 2017-06-03 NOTE — ED Provider Notes (Signed)
[facility] EMERGENCY DEPARTMENT Provider Note   CSN: [id number] Arrival date & time: 06/03/17  1700     History   Chief Complaint Chief Complaint  Patient presents with  . Hand Pain    HPI Jon Archer is a 57 y.o. male past medical history of bilateral carpal tunnel syndrome, diabetes, hypertension who presents for evaluation of right wrist pain that has been ongoing since today.  Patient reports that over the last few weeks, he has engaged in very strenuous work and has been moving boxes.  He states that he has done a lot of manual work.  He denies any preceding trauma, injury, fall.  Patient reports that today, he started experiencing some pain into the right wrist.  Patient states that the pain is worse with movement of his wrist.  He has not taking any medications for the pain.  Patient reports that he will intermittently have some numbness/tingling sensation to the right third digit secondary to his pre-existing carpal tunnel syndrome.  Patient denies any fevers, redness or swelling, numbness/weakness.  The history is provided by the patient.    Past Medical History:  Diagnosis Date  . Carpal tunnel syndrome, bilateral   . Diabetes mellitus without complication (HCC)   . Hyperlipidemia   . Hypertension     Patient Active Problem List   Diagnosis Date Noted  . Essential hypertension, benign 11/24/2015  . Diabetes mellitus type 2 without retinopathy (HCC) 11/24/2015  . Bilateral carpal tunnel syndrome 11/24/2015  . Morbid obesity (HCC) 11/24/2015    Past Surgical History:  Procedure Laterality Date  . TONSILLECTOMY          Home Medications    Prior to Admission medications   Medication Sig Start Date End Date Taking? Authorizing Provider  canagliflozin (INVOKANA) 100 MG TABS tablet TAKE 1 TABLET BY MOUTH ONCE DAILY 04/25/17  Yes [provider]  aspirin 81 MG tablet Take 81 mg by mouth daily.    [provider]  CINNAMON PO Take  2 tablets by mouth 2 (two) times daily.    [provider]  fluticasone (FLONASE) 50 MCG/ACT nasal spray Place 1 spray into both nostrils 2 (two) times daily.    [provider]  glimepiride (AMARYL) 2 MG tablet TAKE ONE TABLET BY MOUTH TWICE DAILY 03/05/17   Copland, Gwenlyn Found, MD  GLUCOMANNAN PO Take 2 tablets by mouth 2 (two) times daily.    [provider]  levocetirizine (XYZAL) 5 MG tablet TAKE 1 TABLET BY MOUTH ONCE DAILY IN THE EVENING 03/26/17   Copland, Gwenlyn Found, MD  lisinopril (PRINIVIL,ZESTRIL) 10 MG tablet TAKE 1 TABLET BY MOUTH ONCE DAILY 05/16/17   Copland, Gwenlyn Found, MD  metFORMIN (GLUCOPHAGE) 1000 MG tablet TAKE ONE TABLET BY MOUTH TWICE DAILY WITH MEALS 12/11/16   Copland, Gwenlyn Found, MD    Family History History reviewed. No pertinent family history.  Social History Social History   Tobacco Use  . Smoking status: Never Smoker  . Smokeless tobacco: Never Used  Substance Use Topics  . Alcohol use: No  . Drug use: No     Allergies   Peanuts [peanut oil] and Meloxicam   Review of Systems Review of Systems  Constitutional: Negative for fever.  Musculoskeletal:       Wrist pain  Neurological: Negative for weakness and numbness.     Physical Exam Updated Vital Signs BP 135/89 (BP Location: Right Arm)   Pulse 72   Temp 97.9 F (  36.6 C) (Oral)   Resp 14   Wt 104.3 kg (230 lb)   SpO2 100%   BMI 33.97 kg/m   Physical Exam  Constitutional: He appears well-developed and well-nourished.  HENT:  Head: Normocephalic and atraumatic.  Eyes: Conjunctivae and EOM are normal. Right eye exhibits no discharge. Left eye exhibits no discharge. No scleral icterus.  Cardiovascular:  Pulses:      Radial pulses are 2+ on the right side, and 2+ on the left side.  Pulmonary/Chest: Effort normal.  Musculoskeletal:  Diffuse tenderness palpation overlying the thenar eminence of the right hand.  No deformity or crepitus noted.  Diffuse tenderness  noted to the radial aspect of the right wrist.  No deformity or crepitus noted.  No overlying warmth, erythema, swelling, ecchymosis.  No snuffbox tenderness noted to the right wrist.  Limited flexion/extension of wrist secondary to patient's pain but is able to complete movements.  Patient can move all 5 digits of right hand without any difficulty.  He does exhibit some pain with flexing all 5 digits.  No tenderness palpation noted to the proximal forearm, elbow, shoulder. Negative Phalens, Negative Tinnells.   Neurological: He is alert.  Sensation intact along major nerve distributions of BUE.  Equal grip strength bilaterally  Skin: Skin is warm and dry. Capillary refill takes less than 2 seconds.  Good distal cap refill. RUE is not dusky in appearance or cool to touch.  Psychiatric: He has a normal mood and affect. His speech is normal and behavior is normal.  Nursing note and vitals reviewed.    ED Treatments / Results  Labs (all labs ordered are listed, but only abnormal results are displayed) Labs Reviewed - No data to display  EKG None  Radiology Dg Wrist Complete Right  Result Date: 06/03/2017 CLINICAL DATA:  Snuffbox pain, no known injury, initial encounter EXAM: RIGHT WRIST - COMPLETE 3+ VIEW COMPARISON:  None. FINDINGS: Mild degenerative changes are noted at the first [facility] joint. No acute fracture or dislocation is noted. Degenerative changes are also seen at the first MCP joint. IMPRESSION: Mild degenerative changes without acute abnormality. Electronically Signed   By: Alcide Clever M.D.   On: 06/03/2017 17:54    Procedures Procedures (including critical care time)  Medications Ordered in ED Medications  acetaminophen (TYLENOL) tablet 650 mg (650 mg Oral Given 06/03/17 1806)     Initial Impression / Assessment and Plan / ED Course  I have reviewed the triage vital signs and the nursing notes.  Pertinent labs & imaging results that were available during my care of the  patient were reviewed by me and considered in my medical decision making (see chart for details).     57 year old male who presents for evaluation of right wrist pain that began today.  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  Patient reports that he has been doing more work than usual over the last 4 weeks.  Patient reports that today, he was moving boxes and stated that he started having some right wrist pain, particularly over the thenar eminence of the right hand.  He denies any preceding trauma, injury, fall.  Does not take any medications for the pain.  Does have a baseline history of carpal tunnel syndrome and will intermittently experience some numbness to the third digit.  Denies any new numbness, weakness. Patient is afebril, non-toxic appearing, sitting comfortably on examination table.  Vital signs reviewed and stable.  Patient is neurovascularly intact.  On exam, patient has diffuse tenderness overlying the thenar eminence of the right hand.  He also exhibits some mild tenderness palpation to the radial aspect of the wrist.  No snuffbox tenderness on my exam.  Triage note had mentioned that patient was having some snuffbox tenderness but on my exam, he does not exhibit any snuffbox tenderness.  Concern for overuse injury such as tendinitis, muscular skeletal injury, ligament strain.  Low suspicion for fracture dislocation given lack of trauma, injury, fall.  History/physical exam is not concerning for acute arterial embolism or DVT of the upper extremity.  Will plan to check x-ray for evaluation.  X-ray reviewed.  Negative for any acute fracture dislocation.  There is mild degenerative changes noted.  I discussed results with patient.  Symptoms likely result of overuse injury versus strain.  We will plan to place patient in a splint for support and stabilization.  Patient encouraged on home therapies use.  We will  plan to give outpatient referral to hand for patient to follow-up with if symptoms do not improve. Patient had ample opportunity for questions and discussion. All patient's questions were answered with full understanding. Strict return precautions discussed. Patient expresses understanding and agreement to plan.   Final Clinical Impressions(s) / ED Diagnoses   Final diagnoses:  Right wrist pain    ED Discharge Orders    None       Maxwell CaulLayden, Lindsey A, PA-C 06/03/17 1924    Rolan BuccoBelfi, Melanie, MD 06/03/17 2311

## 2017-09-01 ENCOUNTER — Other Ambulatory Visit: Payer: Self-pay | Admitting: Family Medicine

## 2017-09-01 DIAGNOSIS — I10 Essential (primary) hypertension: Secondary | ICD-10-CM

## 2017-09-19 ENCOUNTER — Ambulatory Visit (INDEPENDENT_AMBULATORY_CARE_PROVIDER_SITE_OTHER): Payer: BLUE CROSS/BLUE SHIELD | Admitting: Family Medicine

## 2017-09-19 ENCOUNTER — Encounter: Payer: Self-pay | Admitting: Family Medicine

## 2017-09-19 VITALS — BP 122/82 | HR 69 | Temp 98.4°F | Ht 69.5 in | Wt 254.2 lb

## 2017-09-19 DIAGNOSIS — M799 Soft tissue disorder, unspecified: Secondary | ICD-10-CM | POA: Diagnosis not present

## 2017-09-19 DIAGNOSIS — J302 Other seasonal allergic rhinitis: Secondary | ICD-10-CM

## 2017-09-19 DIAGNOSIS — W57XXXA Bitten or stung by nonvenomous insect and other nonvenomous arthropods, initial encounter: Secondary | ICD-10-CM | POA: Diagnosis not present

## 2017-09-19 DIAGNOSIS — M7989 Other specified soft tissue disorders: Secondary | ICD-10-CM

## 2017-09-19 MED ORDER — LEVOCETIRIZINE DIHYDROCHLORIDE 5 MG PO TABS: 5.0000 mg | ORAL_TABLET | Freq: Every evening | ORAL | 3 refills | Status: DC

## 2017-09-19 NOTE — Progress Notes (Signed)
Chief Complaint  Patient presents with  . Mass    right shoulder//no pain/bruise  . Mass    neck/near lumph node  . Fatigue    sunday, spent the day in bed.   . Insect Bite    deer tick on july 5 on left arm    Subjective: Patient is a 57 y.o. male here for a handful of issues. .  2 weeks ago had a bump on back of R shoulder. No shoulder pain. Yesterday had a lump on back of R neck. Very fatigued 3 d ago.   ROS: Heart: Denies chest pain  Lungs: Denies SOB   Past Medical History:  Diagnosis Date  . Carpal tunnel syndrome, bilateral   . Diabetes mellitus without complication (HCC)   . Hyperlipidemia   . Hypertension     Objective: BP 122/82 (BP Location: Left Arm, Patient Position: Sitting, Cuff Size: Large)   Pulse 69   Temp 98.4 F (36.9 C) (Oral)   Ht 5' 9.5" (1.765 m)   Wt 254 lb 4 oz (115.3 kg)   SpO2 97%   BMI 37.01 kg/m  General: Awake, appears stated age HEENT: MMM, EOMi Heart: RRR, no murmurs Lungs: CTAB, no rales, wheezes or rhonchi. No accessory muscle use Skin: Soft tissue mass posterior shoulder and R posterior neck. No TTP, fluctuance, erythema, drainage, excoriation, scaling. On the LUE, there is a small area of erythema with central punctation.  Neck: Supple, no thyromegaly or nodules noted Psych: Age appropriate judgment and insight, normal affect and mood  Assessment and Plan: Soft tissue mass - Plan: US Soft Tissue Head/Neck  Seasonal allergic rhinitis, unspecified trigger - Plan: levocetirizine (XYZAL) 5 MG tablet  Tick bite, initial encounter  Orders as above. OK to wait until mid Aug to schedule.  Watchful waiting for tick bite.   Fatigue is resolving. F/u prn. The patient voiced understanding and agreement to the plan.  Jilda Rocheicholas Paul Lake LureWendling, DO 09/19/17  11:28 AM

## 2017-09-19 NOTE — Patient Instructions (Addendum)
OK to wait until mid-August to schedule the ultrasound.  I wouldn't do anything with the tick bite.  If the fatigue returns or gets worse, let us know.  Let us know if you need anything.

## 2017-09-19 NOTE — Progress Notes (Signed)
Pre visit review using our clinic review tool, if applicable. No additional management support is needed unless otherwise documented below in the visit note. 

## 2017-10-01 ENCOUNTER — Other Ambulatory Visit: Payer: Self-pay | Admitting: Family Medicine

## 2017-10-01 DIAGNOSIS — I10 Essential (primary) hypertension: Secondary | ICD-10-CM

## 2017-10-31 ENCOUNTER — Ambulatory Visit (HOSPITAL_BASED_OUTPATIENT_CLINIC_OR_DEPARTMENT_OTHER)
Admission: RE | Admit: 2017-10-31 | Discharge: 2017-10-31 | Disposition: A | Discharge status: Home / Self Care | Payer: BLUE CROSS/BLUE SHIELD | Source: Ambulatory Visit | Attending: Family Medicine | Admitting: Family Medicine

## 2017-10-31 DIAGNOSIS — M7989 Other specified soft tissue disorders: Secondary | ICD-10-CM

## 2017-10-31 DIAGNOSIS — M799 Soft tissue disorder, unspecified: Secondary | ICD-10-CM | POA: Insufficient documentation

## 2017-11-01 ENCOUNTER — Encounter: Payer: BLUE CROSS/BLUE SHIELD | Admitting: Family Medicine

## 2017-11-23 ENCOUNTER — Other Ambulatory Visit: Payer: Self-pay

## 2017-11-23 ENCOUNTER — Ambulatory Visit (INDEPENDENT_AMBULATORY_CARE_PROVIDER_SITE_OTHER): Payer: BLUE CROSS/BLUE SHIELD | Admitting: Family Medicine

## 2017-11-23 ENCOUNTER — Encounter: Payer: Self-pay | Admitting: Family Medicine

## 2017-11-23 VITALS — BP 108/70 | HR 68 | Temp 97.9°F | Resp 14 | Ht 70.0 in | Wt 250.0 lb

## 2017-11-23 DIAGNOSIS — Z0000 Encounter for general adult medical examination without abnormal findings: Secondary | ICD-10-CM

## 2017-11-23 DIAGNOSIS — Z1159 Encounter for screening for other viral diseases: Secondary | ICD-10-CM

## 2017-11-23 DIAGNOSIS — Z114 Encounter for screening for human immunodeficiency virus [HIV]: Secondary | ICD-10-CM | POA: Diagnosis not present

## 2017-11-23 LAB — COMPREHENSIVE METABOLIC PANEL
ALT: 21 U/L (ref 0–53)
AST: 15 U/L (ref 0–37)
Albumin: 4.1 g/dL (ref 3.5–5.2)
Alkaline Phosphatase: 76 U/L (ref 39–117)
BUN: 33 mg/dL — ABNORMAL HIGH (ref 6–23)
CO2: 27 mEq/L (ref 19–32)
Calcium: 9.2 mg/dL (ref 8.4–10.5)
Chloride: 106 mEq/L (ref 96–112)
Creatinine, Ser: 0.88 mg/dL (ref 0.40–1.50)
GFR: 94.83 mL/min (ref >60.00)
Glucose, Bld: 121 mg/dL — ABNORMAL HIGH (ref 70–99)
Potassium: 4.2 mEq/L (ref 3.5–5.1)
Sodium: 141 mEq/L (ref 135–145)
Total Bilirubin: 0.4 mg/dL (ref 0.2–1.2)
Total Protein: 6.1 g/dL (ref 6.0–8.3)

## 2017-11-23 LAB — LIPID PANEL
Cholesterol: 229 mg/dL — ABNORMAL HIGH (ref 0–200)
HDL: 34.6 mg/dL — ABNORMAL LOW (ref >39.00)
LDL Cholesterol: 164 mg/dL — ABNORMAL HIGH (ref 0–99)
NonHDL: 194.28
Total CHOL/HDL Ratio: 7
Triglycerides: 152 mg/dL — ABNORMAL HIGH (ref 0.0–149.0)
VLDL: 30.4 mg/dL (ref 0.0–40.0)

## 2017-11-23 NOTE — Patient Instructions (Addendum)
The new Shingrix vaccine (for shingles) is a 2 shot series. It can make people feel low energy, achy and almost like they have the flu for 48 hours after injection. Please plan accordingly when deciding on when to get this shot. Call our office for a nurse visit appointment to get this. The second shot of the series is less severe regarding the side effects, but it still lasts 48 hours.   Aim to do some physical exertion for 150 minutes per week. This is typically divided into 5 days per week, 30 minutes per day. The activity should be enough to get your heart rate up. Anything is better than nothing if you have time constraints.  Keep the diet clean.  Ice/cold pack over area for 10-15 min twice daily.  Heat (pad or rice pillow in microwave) over affected area, 10-15 minutes twice daily.   Let us know if you need anything.  EXERCISES  RANGE OF MOTION (ROM) AND STRETCHING EXERCISES These exercises may help you when beginning to rehabilitate your injury. While completing these exercises, remember:   Restoring tissue flexibility helps normal motion to return to the joints. This allows healthier, less painful movement and activity.  An effective stretch should be held for at least 30 seconds.  A stretch should never be painful. You should only feel a gentle lengthening or release in the stretched tissue.  ROM - Pendulum  Bend at the waist so that your right / left arm falls away from your body. Support yourself with your opposite hand on a solid surface, such as a table or a countertop.  Your right / left arm should be perpendicular to the ground. If it is not perpendicular, you need to lean over farther. Relax the muscles in your right / left arm and shoulder as much as possible.  Gently sway your hips and trunk so they move your right / left arm without any use of your right / left shoulder muscles.  Progress your movements so that your right / left arm moves side to side, then forward  and backward, and finally, both clockwise and counterclockwise.  Complete 10-15 repetitions in each direction. Many people use this exercise to relieve discomfort in their shoulder as well as to gain range of motion. Repeat 2 times. Complete this exercise 3 times per week.  STRETCH - Flexion, Standing  Stand with good posture. With an underhand grip on your right / left hand and an overhand grip on the opposite hand, grasp a broomstick or cane so that your hands are a little more than shoulder-width apart.  Keeping your right / left elbow straight and shoulder muscles relaxed, push the stick with your opposite hand to raise your right / left arm in front of your body and then overhead. Raise your arm until you feel a stretch in your right / left shoulder, but before you have increased shoulder pain.  Try to avoid shrugging your right / left shoulder as your arm rises by keeping your shoulder blade tucked down and toward your mid-back spine. Hold 30 seconds.  Slowly return to the starting position. Repeat 2 times. Complete this exercise 3 times per week.  STRETCH - Internal Rotation  Place your right / left hand behind your back, palm-up.  Throw a towel or belt over your opposite shoulder. Grasp the towel/belt with your right / left hand.  While keeping an upright posture, gently pull up on the towel/belt until you feel a stretch in the front  of your right / left shoulder.  Avoid shrugging your right / left shoulder as your arm rises by keeping your shoulder blade tucked down and toward your mid-back spine.  Hold 30. Release the stretch by lowering your opposite hand. Repeat 2 times. Complete this exercise 3 times per week.  STRETCH - External Rotation and Abduction  Stagger your stance through a doorframe. It does not matter which foot is forward.  As instructed by your physician, physical therapist or athletic trainer, place your hands: ? And forearms above your head and on the  door frame. ? And forearms at head-height and on the door frame. ? At elbow-height and on the door frame.  Keeping your head and chest upright and your stomach muscles tight to prevent over-extending your low-back, slowly shift your weight onto your front foot until you feel a stretch across your chest and/or in the front of your shoulders.  Hold 30 seconds. Shift your weight to your back foot to release the stretch. Repeat 2 times. Complete this stretch 3 times per week.   STRENGTHENING EXERCISES  These exercises may help you when beginning to rehabilitate your injury. They may resolve your symptoms with or without further involvement from your physician, physical therapist or athletic trainer. While completing these exercises, remember:   Muscles can gain both the endurance and the strength needed for everyday activities through controlled exercises.  Complete these exercises as instructed by your physician, physical therapist or athletic trainer. Progress the resistance and repetitions only as guided.  You may experience muscle soreness or fatigue, but the pain or discomfort you are trying to eliminate should never worsen during these exercises. If this pain does worsen, stop and make certain you are following the directions exactly. If the pain is still present after adjustments, discontinue the exercise until you can discuss the trouble with your clinician.  If advised by your physician, during your recovery, avoid activity or exercises which involve actions that place your right / left hand or elbow above your head or behind your back or head. These positions stress the tissues which are trying to heal.  STRENGTH - Scapular Depression and Adduction  With good posture, sit on a firm chair. Supported your arms in front of you with pillows, arm rests or a table top. Have your elbows in line with the sides of your body.  Gently draw your shoulder blades down and toward your mid-back spine.  Gradually increase the tension without tensing the muscles along the top of your shoulders and the back of your neck.  Hold for 3 seconds. Slowly release the tension and relax your muscles completely before completing the next repetition.  After you have practiced this exercise, remove the arm support and complete it in standing as well as sitting. Repeat 2 times. Complete this exercise 3 times per week.   STRENGTH - External Rotators  Secure a rubber exercise band/tubing to a fixed object so that it is at the same height as your right / left elbow when you are standing or sitting on a firm surface.  Stand or sit so that the secured exercise band/tubing is at your side that is not injured.  Bend your elbow 90 degrees. Place a folded towel or small pillow under your right / left arm so that your elbow is a few inches away from your side.  Keeping the tension on the exercise band/tubing, pull it away from your body, as if pivoting on your elbow. Be sure to  keep your body steady so that the movement is only coming from your shoulder rotating.  Hold 3 seconds. Release the tension in a controlled manner as you return to the starting position. Repeat 2 times. Complete this exercise 3 times per week.   STRENGTH - Supraspinatus  Stand or sit with good posture. Grasp a 2-3 lb weight or an exercise band/tubing so that your hand is "thumbs-up," like when you shake hands.  Slowly lift your right / left hand from your thigh into the air, traveling about 30 degrees from straight out at your side. Lift your hand to shoulder height or as far as you can without increasing any shoulder pain. Initially, many people do not lift their hands above shoulder height.  Avoid shrugging your right / left shoulder as your arm rises by keeping your shoulder blade tucked down and toward your mid-back spine.  Hold for 3 seconds. Control the descent of your hand as you slowly return to your starting position. Repeat 2  times. Complete this exercise 3 times per week.   STRENGTH - Shoulder Extensors  Secure a rubber exercise band/tubing so that it is at the height of your shoulders when you are either standing or sitting on a firm arm-less chair.  With a thumbs-up grip, grasp an end of the band/tubing in each hand. Straighten your elbows and lift your hands straight in front of you at shoulder height. Step back away from the secured end of band/tubing until it becomes tense.  Squeezing your shoulder blades together, pull your hands down to the sides of your thighs. Do not allow your hands to go behind you.  Hold for 3 seconds. Slowly ease the tension on the band/tubing as you reverse the directions and return to the starting position. Repeat 2 times. Complete this exercise 3 times per week.   STRENGTH - Scapular Retractors  Secure a rubber exercise band/tubing so that it is at the height of your shoulders when you are either standing or sitting on a firm arm-less chair.  With a palm-down grip, grasp an end of the band/tubing in each hand. Straighten your elbows and lift your hands straight in front of you at shoulder height. Step back away from the secured end of band/tubing until it becomes tense.  Squeezing your shoulder blades together, draw your elbows back as you bend them. Keep your upper arm lifted away from your body throughout the exercise.  Hold 3 seconds. Slowly ease the tension on the band/tubing as you reverse the directions and return to the starting position. Repeat 2 times. Complete this exercise 3 times per week.  STRENGTH - Scapular Depressors  Find a sturdy chair without wheels, such as a from a dining room table.  Keeping your feet on the floor, lift your bottom from the seat and lock your elbows.  Keeping your elbows straight, allow gravity to pull your body weight down. Your shoulders will rise toward your ears.  Raise your body against gravity by drawing your shoulder blades  down your back, shortening the distance between your shoulders and ears. Although your feet should always maintain contact with the floor, your feet should progressively support less body weight as you get stronger.  Hold 3 seconds. In a controlled and slow manner, lower your body weight to begin the next repetition. Repeat 2 times. Complete this exercise 3 times per week.   This information is not intended to replace advice given to you by your health care provider. Make sure  you discuss any questions you have with your health care provider.  Document Released: 12/28/2004 Document Revised: 03/06/2014 Document Reviewed: 05/28/2008 [employer] Interactive Patient Education [employer]

## 2017-11-23 NOTE — Progress Notes (Signed)
Chief Complaint  Patient presents with  . Annual Exam    Well Male Jon Archer is here for a complete physical.   His last physical was >1 year ago.  Current diet: in general, an "OK" diet.  Current exercise: physically active at work; ladders and walking Weight trend: stable Seat belt? Yes.    Health maintenance Shingrix- No Colonoscopy- Yes Tetanus- Yes HIV- No Hep C- No Prostate cancer screening- No   Past Medical History:  Diagnosis Date  . Carpal tunnel syndrome, bilateral   . Diabetes mellitus without complication (HCC)   . Hyperlipidemia   . Hypertension       Past Surgical History:  Procedure Laterality Date  . TONSILLECTOMY      Medications  Current Outpatient Medications on File Prior to Visit  Medication Sig Dispense Refill  . aspirin 81 MG tablet Take 81 mg by mouth daily.    . canagliflozin (INVOKANA) 100 MG TABS tablet TAKE 1 TABLET BY MOUTH ONCE DAILY    . CINNAMON PO Take 2 tablets by mouth 2 (two) times daily.    . fluticasone (FLONASE) 50 MCG/ACT nasal spray Place 1 spray into both nostrils 2 (two) times daily.    Marland Kitchen glimepiride (AMARYL) 2 MG tablet TAKE ONE TABLET BY MOUTH TWICE DAILY 12 tablet 59  . GLUCOMANNAN PO Take 2 tablets by mouth 2 (two) times daily.    Marland Kitchen levocetirizine (XYZAL) 5 MG tablet Take 1 tablet (5 mg total) by mouth every evening. 90 tablet 3  . lisinopril (PRINIVIL,ZESTRIL) 10 MG tablet TAKE 1 TABLET BY MOUTH ONCE DAILY 90 tablet 1  . metFORMIN (GLUCOPHAGE) 1000 MG tablet TAKE ONE TABLET BY MOUTH TWICE DAILY WITH MEALS 180 tablet 3   Allergies Allergies  Allergen Reactions  . Peanuts [Peanut Oil] Itching  . Meloxicam Nausea Only    Family History Family History  Problem Relation Age of Onset  . Heart disease Father     Review of Systems: Constitutional:  no fevers Eye:  no recent significant change in vision Ear/Nose/Mouth/Throat:  Ears:  no hearing loss Nose/Mouth/Throat:  no complaints of nasal congestion,  no sore throat Cardiovascular:  no chest pain, no palpitations Respiratory:  no cough and no shortness of breath Gastrointestinal:  no abdominal pain, no change in bowel habits GU:  Male: negative for dysuria, frequency, and incontinence and negative for prostate symptoms Musculoskeletal/Extremities: +R shoulder pain; otherwise no pain, redness, or swelling of the joints Integumentary (Skin/Breast):  no abnormal skin lesions reported Neurologic: +tingling in b/l hands and some decreased grip strength; no headaches Endocrine: No unexpected weight changes Hematologic/Lymphatic:  no abnormal bleeding  Exam BP 108/70 (BP Location: Right Arm, Patient Position: Sitting, Cuff Size: Large)   Pulse 68   Temp 97.9 F (36.6 C)   Resp 14   Ht 5\' 10"  (1.778 m)   Wt 250 lb (113.4 kg)   SpO2 97%   BMI 35.87 kg/m  General:  well developed, well nourished, in no apparent distress Skin:  no significant moles, warts, or growths Head:  no masses, lesions, or tenderness Eyes:  pupils equal and round, sclera anicteric without injection Ears:  canals without lesions, TMs shiny without retraction, no obvious effusion, no erythema Nose:  nares patent, septum midline, mucosa normal Throat/Pharynx:  lips and gingiva without lesion; tongue and uvula midline; non-inflamed pharynx; no exudates or postnasal drainage Neck: neck supple without adenopathy, thyromegaly, or masses Lungs:  clear to auscultation, breath sounds equal bilaterally, no respiratory  distress Cardio:  regular rate and rhythm, no LE edema, no bruits Abdomen:  abdomen soft, nontender; bowel sounds normal; no masses or organomegaly Genital (male): circumcised penis, no lesions or discharge; testes present bilaterally without masses or tenderness Rectal: Deferred Musculoskeletal:  symmetrical muscle groups noted without atrophy or deformity Extremities:  no clubbing, cyanosis, or edema, no deformities, no skin discoloration Neuro:  gait normal;  deep tendon reflexes normal and symmetric Psych: well oriented with normal range of affect and appropriate judgment/insight  Assessment and Plan  Well adult exam - Plan: Comprehensive metabolic panel, Lipid panel  Screening for HIV (human immunodeficiency virus) - Plan: HIV Antibody (routine testing w rflx)  Encounter for hepatitis C screening test for low risk patient - Plan: Hepatitis C antibody   Well 57 y.o. male. Counseled on diet and exercise. Counseled on risks and benefits of prostate cancer screening with PSA. The patient agrees to forego testing. Immunizations, labs, and further orders as above. Pt has hx of shoulder pain, not interested in pursuing management so gave stretches/exercises. Hx of CTS, rec'd going back on splints and stretching wrists. Offered injections, OT and referral to hand, but he opted to hold off for now.  Follow up in 6 mo for med check or prn. The patient voiced understanding and agreement to the plan.  Jilda Roche [place], DO 11/23/17 7:31 AM

## 2017-11-24 LAB — HEPATITIS C ANTIBODY
Hepatitis C Ab: NONREACTIVE
SIGNAL TO CUT-OFF: 0.01 (ref <1.00)

## 2017-11-24 LAB — HIV ANTIBODY (ROUTINE TESTING W REFLEX): HIV 1&2 Ab, 4th Generation: NONREACTIVE

## 2017-12-17 ENCOUNTER — Other Ambulatory Visit: Payer: Self-pay | Admitting: Family Medicine

## 2017-12-17 DIAGNOSIS — E119 Type 2 diabetes mellitus without complications: Secondary | ICD-10-CM

## 2018-04-01 ENCOUNTER — Other Ambulatory Visit: Payer: Self-pay | Admitting: Family Medicine

## 2018-04-01 DIAGNOSIS — E119 Type 2 diabetes mellitus without complications: Secondary | ICD-10-CM

## 2018-05-09 ENCOUNTER — Other Ambulatory Visit: Payer: Self-pay | Admitting: Family Medicine

## 2018-05-09 DIAGNOSIS — I10 Essential (primary) hypertension: Secondary | ICD-10-CM

## 2018-05-15 MED ORDER — LISINOPRIL 10 MG PO TABS: 10.0000 mg | ORAL_TABLET | Freq: Every day | ORAL | 0 refills | Status: DC

## 2018-05-15 NOTE — Telephone Encounter (Signed)
Pt is out of med

## 2018-05-15 NOTE — Addendum Note (Signed)
Addended by: Mervin Kung A on: 05/15/2018 07:42 AM   Modules accepted: Orders

## 2018-05-15 NOTE — Telephone Encounter (Signed)
Spoke with pt. Our record indicates Rx was sent on 09/08/18. He states pharmacy is telling him they did not receive the RX. Advised pt we would send another RX and if they still do not receive it today to let us know and we will give them a verbal. Pt is agreeable. Rx sent.

## 2018-07-09 ENCOUNTER — Ambulatory Visit (INDEPENDENT_AMBULATORY_CARE_PROVIDER_SITE_OTHER): Payer: BLUE CROSS/BLUE SHIELD | Admitting: Family Medicine

## 2018-07-09 ENCOUNTER — Other Ambulatory Visit: Payer: Self-pay | Admitting: Family Medicine

## 2018-07-09 ENCOUNTER — Encounter: Payer: Self-pay | Admitting: Family Medicine

## 2018-07-09 ENCOUNTER — Other Ambulatory Visit: Payer: Self-pay

## 2018-07-09 DIAGNOSIS — I10 Essential (primary) hypertension: Secondary | ICD-10-CM

## 2018-07-09 DIAGNOSIS — E119 Type 2 diabetes mellitus without complications: Secondary | ICD-10-CM

## 2018-07-09 MED ORDER — GLIMEPIRIDE 2 MG PO TABS: 2.0000 mg | ORAL_TABLET | Freq: Two times a day (BID) | ORAL | 1 refills | Status: DC

## 2018-07-09 MED ORDER — DAPAGLIFLOZIN PROPANEDIOL 5 MG PO TABS: 5.0000 mg | ORAL_TABLET | Freq: Every day | ORAL | 3 refills | Status: DC

## 2018-07-09 NOTE — Progress Notes (Signed)
Subjective:   Chief Complaint  Patient presents with  . Medication Refill    MERON BOSTROM is a 58 y.o. male here for follow-up of diabetes.  Due to COVID-19 pandemic, we are interacting via web portal for an electronic face-to-face visit. I verified patient's ID using 2 identifiers. Patient agreed to proceed with visit via this method. Patient is at home, I am at home. Patient and I are present for visit.  Jerik's self monitored glucose range is 120-140s.  Patient denies hypoglycemic reactions. Patient does not require insulin.   Medications include: Metformin 1 g bid, Invokana 100 mg/d, Amaryl 2 mg bid Exercise: active around house Diet: "OK"  Hypertension Patient presents for hypertension follow up. He does not monitor home blood pressures. He is compliant with medications. Patient has these side effects of medication: none He is sometimes adhering to a healthy diet overall. Exercise: active around house and in yard   Past Medical History:  Diagnosis Date  . Carpal tunnel syndrome, bilateral   . Diabetes mellitus without complication (HCC)   . Hyperlipidemia   . Hypertension      Related testing: Pneumovax: done Eye exam out of question at this time 2/2 pandemic  Review of Systems: Pulmonary:  No SOB Cardiovascular:  No chest pain  Objective:  No conversational dyspnea Age appropriate judgment and insight Nml affect and mood  Assessment:   Essential hypertension, benign  Controlled type 2 diabetes mellitus without complication, without long-term current use of insulin (HCC) - Plan: glimepiride (AMARYL) 2 MG tablet, Hemoglobin A1c, Microalbumin / creatinine urine ratio, Lipid panel, Comprehensive metabolic panel, CBC   Plan:   Orders as above. Invokana too expensive, will try [place]. Hopefully we can stay with SGLT-2 inhibitor class.  Counseled on diet and exercise. F/u in 6 mo. The patient voiced understanding and agreement to the plan.  Jilda Roche [place], DO 07/09/18 3:19 PM

## 2018-07-19 ENCOUNTER — Telehealth: Payer: Self-pay | Admitting: Family Medicine

## 2018-07-19 NOTE — Telephone Encounter (Signed)
Copied from CRM [id number]. Topic: Quick Communication - Rx Refill/Question >> Jul 19, 2018 10:34 AM Angela Nevin wrote: Medication: dapagliflozin propanediol (FARXIGA) 5 MG TABS tablet   Patient requesting call back from CMA to get clarification on how to take this medication.

## 2018-07-19 NOTE — Telephone Encounter (Signed)
Called home/celll number left message to call back.

## 2018-07-23 NOTE — Telephone Encounter (Signed)
Called left message to call back 

## 2018-07-24 NOTE — Telephone Encounter (Signed)
Unable to get the patient on the phone.

## 2018-08-26 ENCOUNTER — Other Ambulatory Visit: Payer: Self-pay | Admitting: Family Medicine

## 2018-08-26 DIAGNOSIS — I10 Essential (primary) hypertension: Secondary | ICD-10-CM

## 2018-09-15 IMAGING — US US SOFT TISSUE HEAD/NECK
1 series · 13 of 25 positions shown · non-contrast
Comparison: None.

CLINICAL DATA: 57-year-old male with a history of right shoulder
and neck stiffness.

EXAM:
ULTRASOUND OF HEAD/NECK SOFT TISSUES
TECHNIQUE: Ultrasound examination of the head and neck soft tissues was
performed in the area of clinical concern.

[Series 1: us soft tissue head/neck · 0.07mm/px · 13 of 51 slices shown]
[im 1/51]
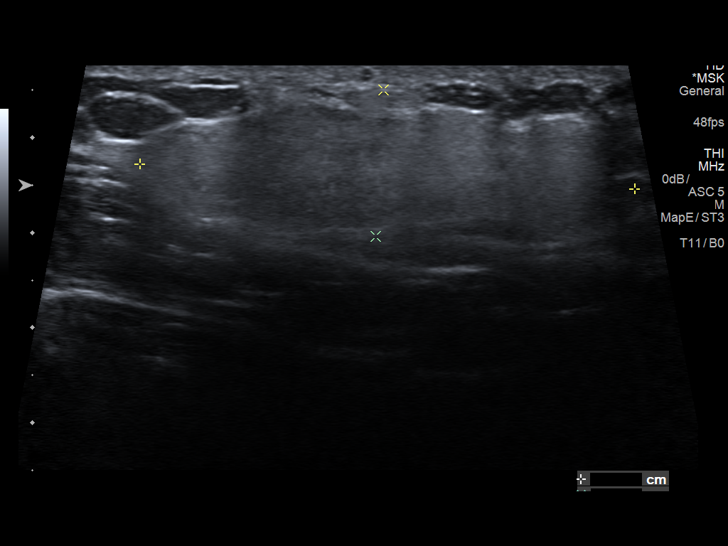
[im 5/51]
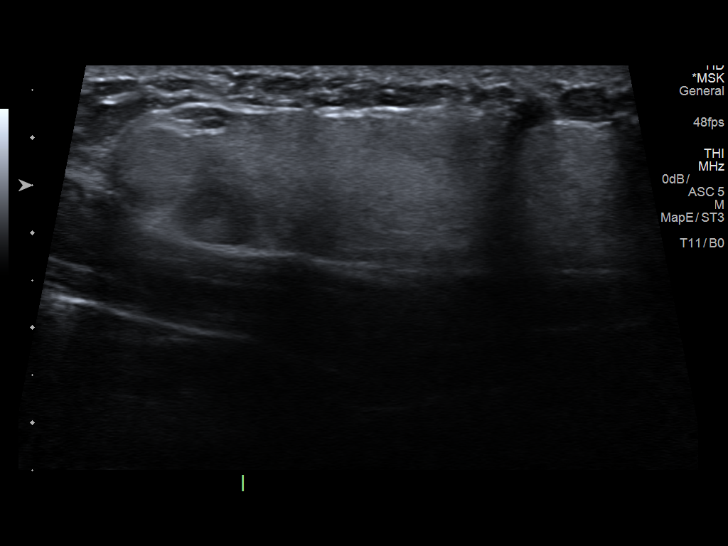
[im 9/51]
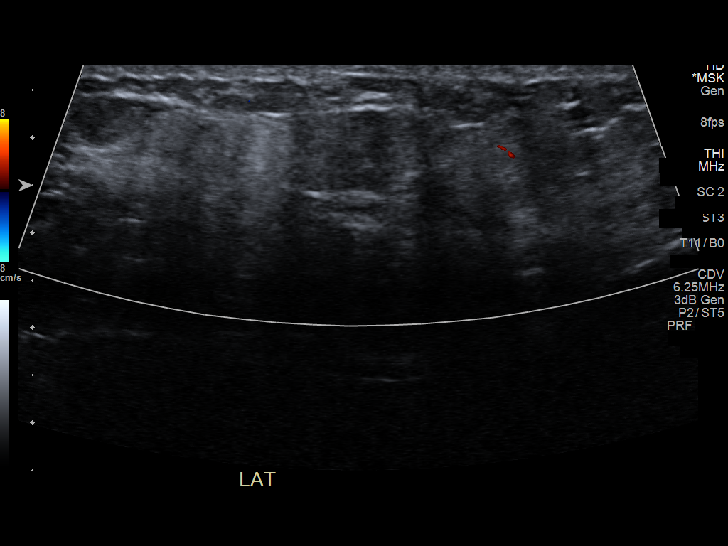
[im 13/51]
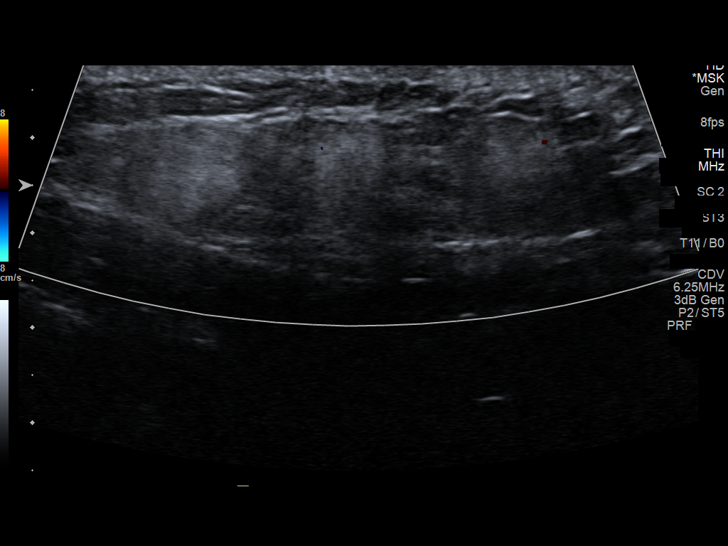
[im 17/51]
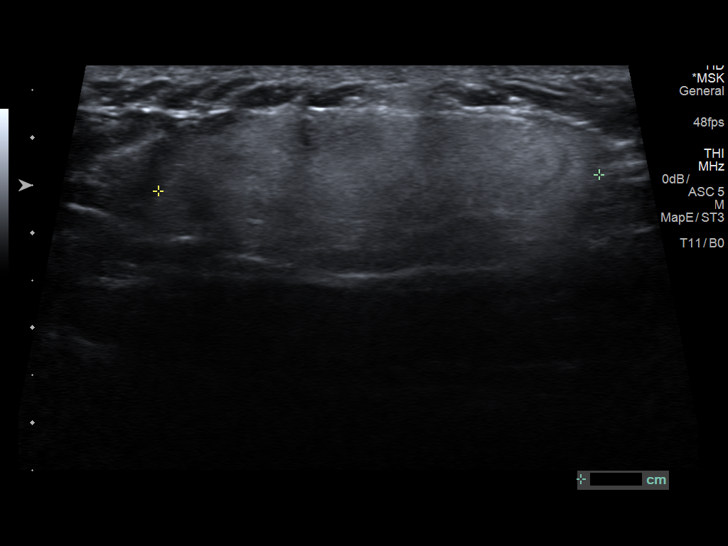
[im 21/51]
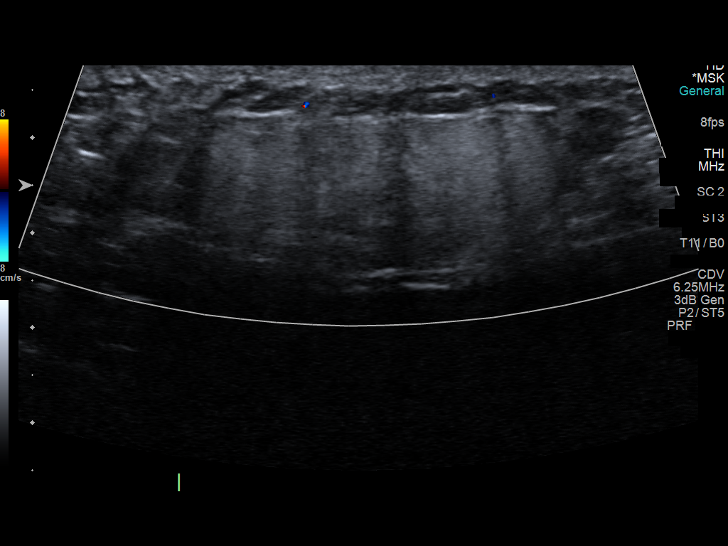
[im 26/51]
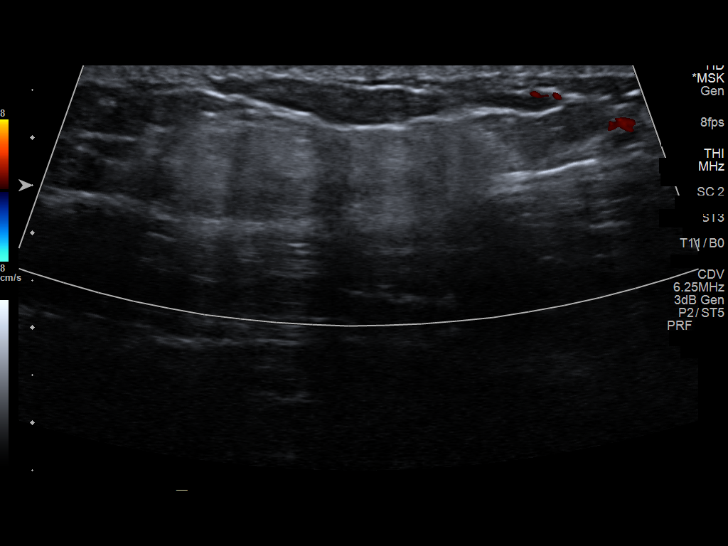
[im 30/51]
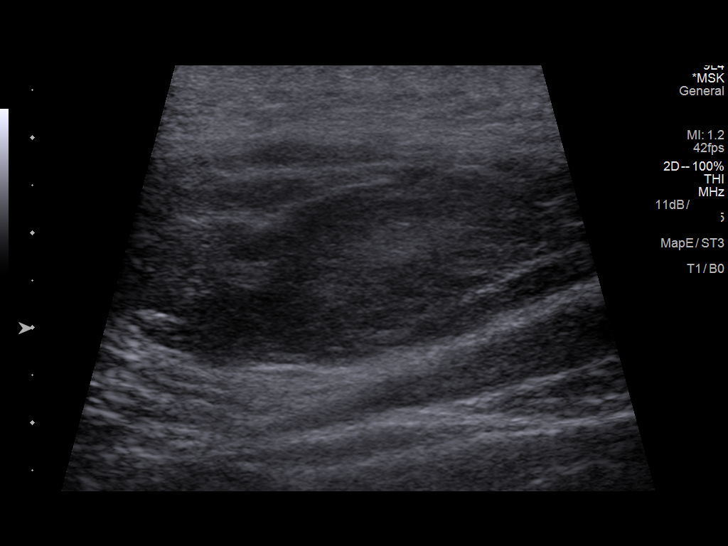
[im 34/51]
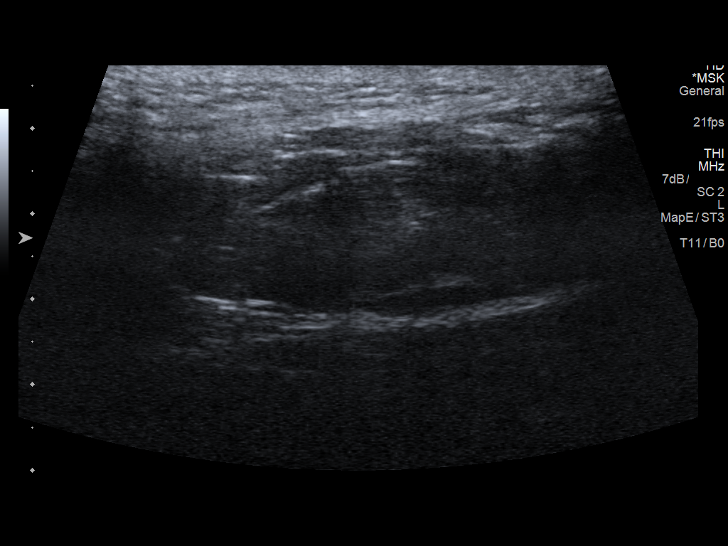
[im 38/51]
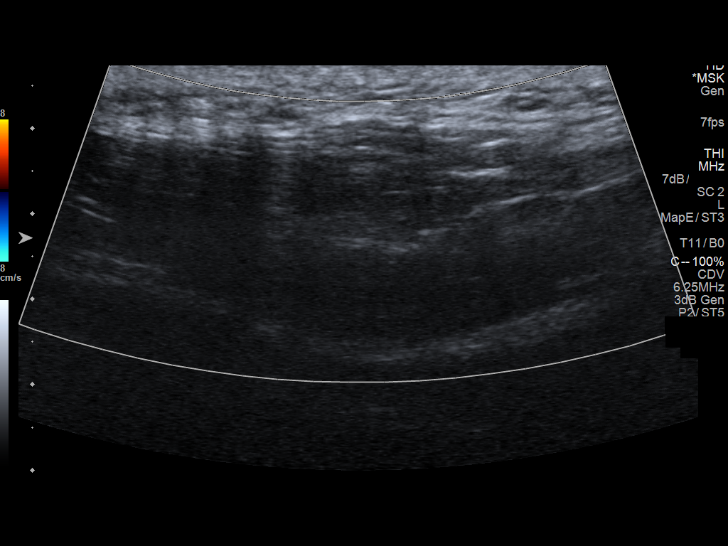
[im 42/51]
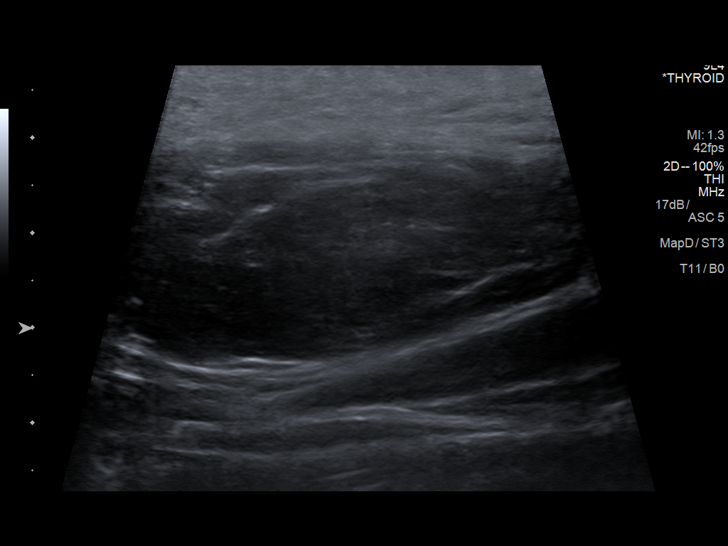
[im 46/51]
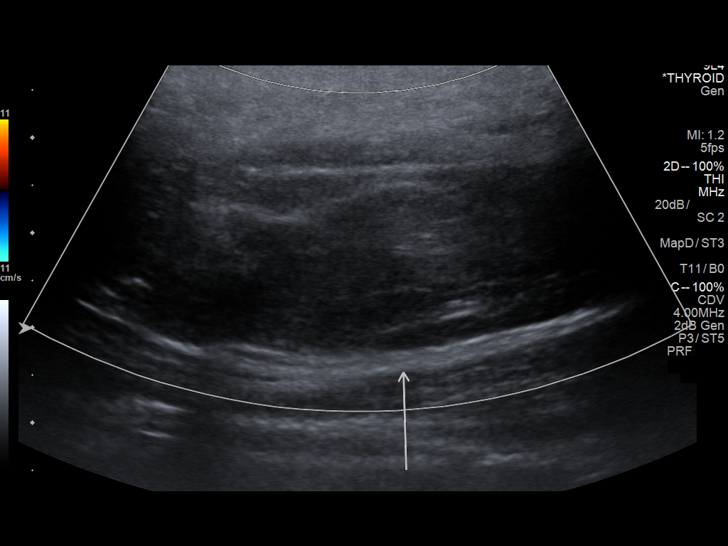
[im 51/51]
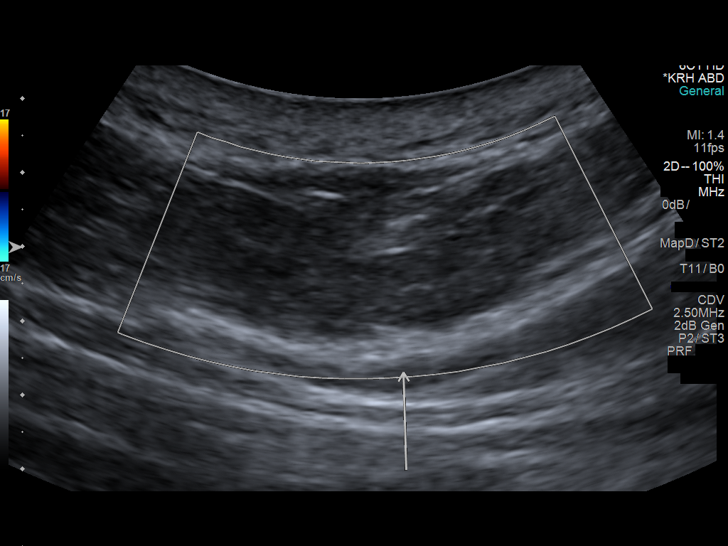

[13 of 25 positions shown; findings below may reference images not displayed]

FINDINGS: Grayscale and color duplex imaging performed in the region of
concern.

First measured lesion is relatively homogeneous hyperechoic soft
tissue within the superficial soft tissues between muscle and skin
measuring 5.2 cm x 1.5 cm x 4.6 cm. The margin is fairly
well-defined with no significant internal flow or fluid component.

These second measured lesion is homogeneous [place] hypoechoic and
potentially involves musculature of the lateral posterior neck,
measuring 4.7 cm x 2.0 cm x 4.7 cm. No significant internal flow.
IMPRESSION: Limited ultrasound demonstrates nonspecific findings, with 2
separate soft tissue lesions identified in the region of clinical
concern. Ultrasound cannot confer benignity or malignancy of either
lesion. While the most likely differential diagnosis at both
locations would be atypical lipoma, further evaluation with
contrast-enhanced cross-sectional imaging (either CT or MRI) is
recommended.

## 2018-10-11 ENCOUNTER — Other Ambulatory Visit: Payer: Self-pay | Admitting: Family Medicine

## 2018-10-11 DIAGNOSIS — J302 Other seasonal allergic rhinitis: Secondary | ICD-10-CM

## 2018-12-07 ENCOUNTER — Other Ambulatory Visit: Payer: Self-pay | Admitting: Family Medicine

## 2018-12-07 DIAGNOSIS — I10 Essential (primary) hypertension: Secondary | ICD-10-CM

## 2019-01-06 ENCOUNTER — Other Ambulatory Visit: Payer: Self-pay | Admitting: Family Medicine

## 2019-01-06 DIAGNOSIS — J302 Other seasonal allergic rhinitis: Secondary | ICD-10-CM

## 2019-01-23 IMAGING — CR DG HUMERUS 2V *R*
3 series · 3 of 3 positions shown · non-contrast
Comparison: None.

CLINICAL DATA: Injury.

EXAM:
RIGHT HUMERUS - 2+ VIEW

[w humerus ap right *]
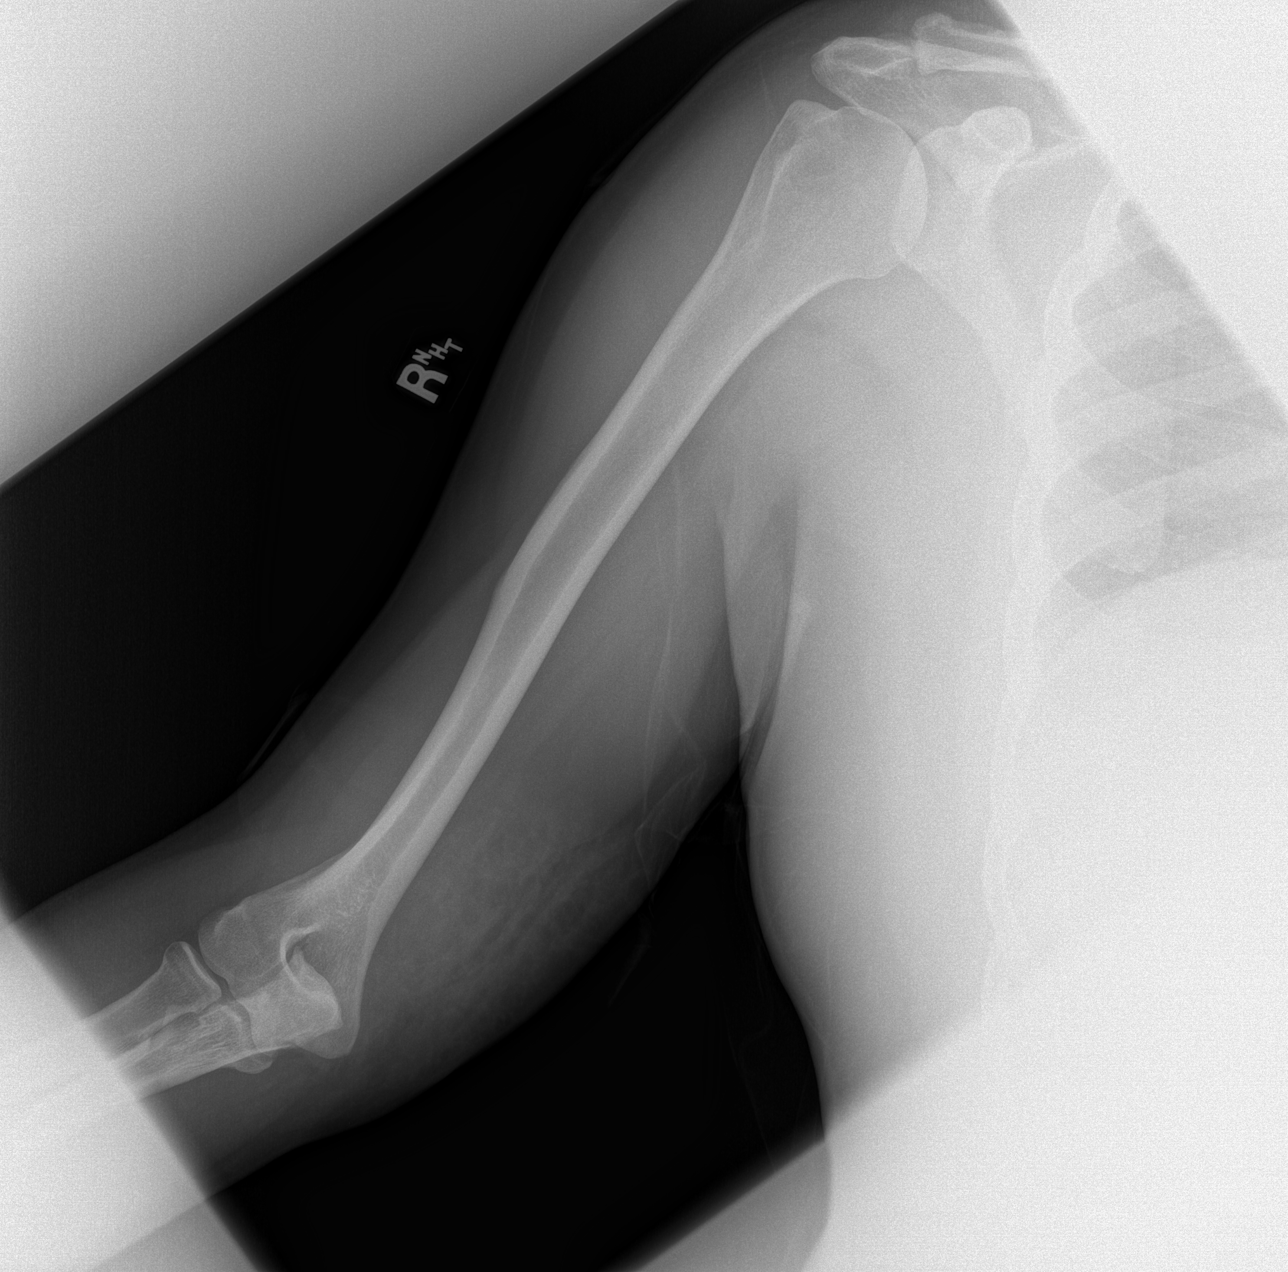

[w humerus lat right * (1 of 2)]
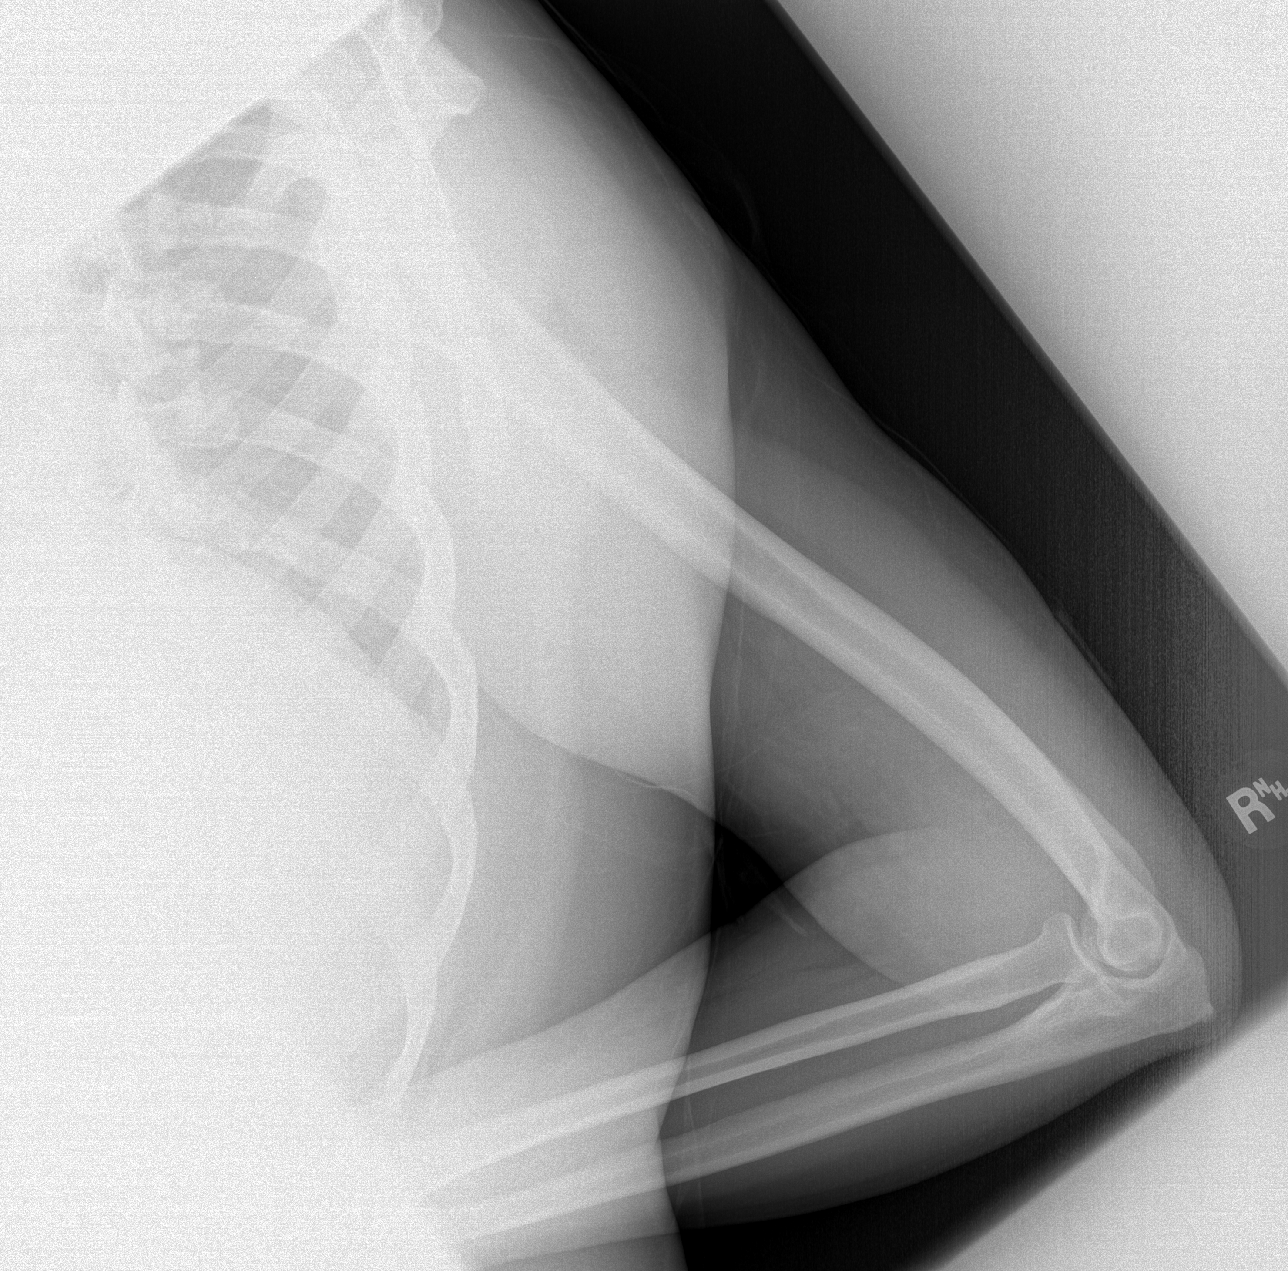

[w humerus lat right * (2 of 2)]
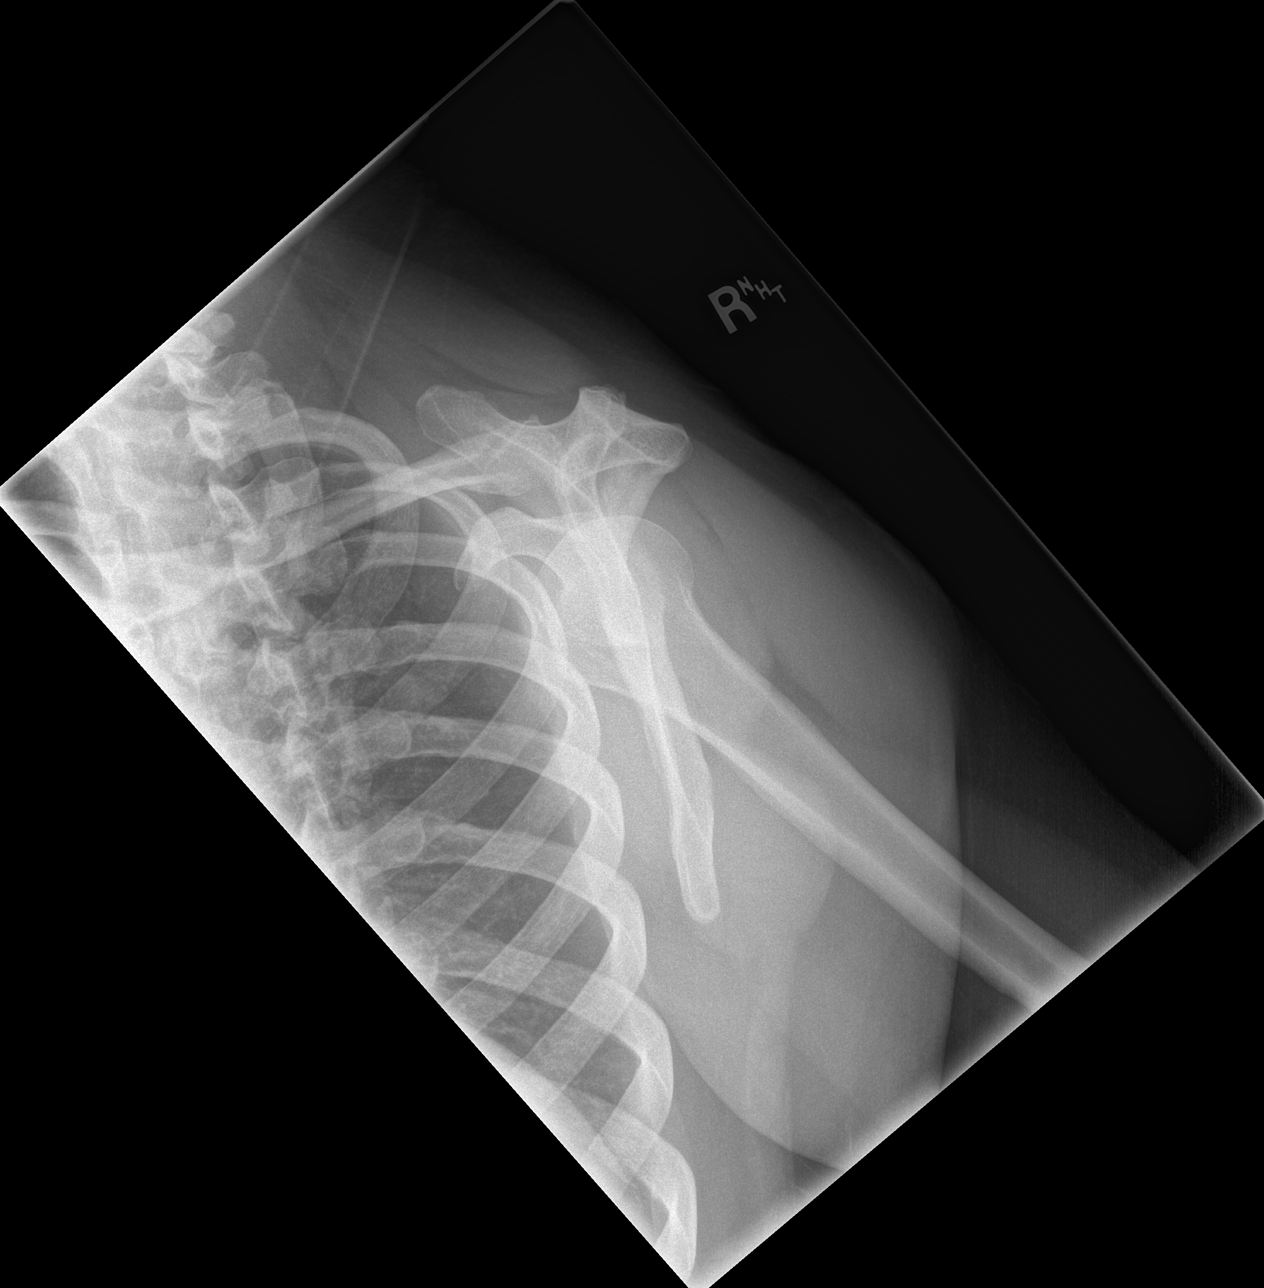

[3 of 3 positions shown; findings below may reference images not displayed]

FINDINGS: Soft tissue swelling about the medial lower right humerus without
fracture or malalignment.
IMPRESSION: Soft tissue swelling without osseous abnormality.

## 2019-01-27 ENCOUNTER — Other Ambulatory Visit: Payer: Self-pay | Admitting: Family Medicine

## 2019-01-27 DIAGNOSIS — E119 Type 2 diabetes mellitus without complications: Secondary | ICD-10-CM

## 2019-01-31 ENCOUNTER — Ambulatory Visit (INDEPENDENT_AMBULATORY_CARE_PROVIDER_SITE_OTHER): Payer: BC Managed Care – PPO | Admitting: Family Medicine

## 2019-01-31 ENCOUNTER — Other Ambulatory Visit: Payer: Self-pay

## 2019-01-31 ENCOUNTER — Encounter: Payer: Self-pay | Admitting: Family Medicine

## 2019-01-31 VITALS — BP 130/76 | HR 74 | Temp 97.8°F | Resp 18 | Wt 253.0 lb

## 2019-01-31 DIAGNOSIS — E119 Type 2 diabetes mellitus without complications: Secondary | ICD-10-CM | POA: Diagnosis not present

## 2019-01-31 DIAGNOSIS — R3915 Urgency of urination: Secondary | ICD-10-CM

## 2019-01-31 DIAGNOSIS — I10 Essential (primary) hypertension: Secondary | ICD-10-CM | POA: Diagnosis not present

## 2019-01-31 MED ORDER — SULFAMETHOXAZOLE-TRIMETHOPRIM 800-160 MG PO TABS: 1.0000 | ORAL_TABLET | Freq: Two times a day (BID) | ORAL | 0 refills | Status: AC

## 2019-01-31 MED ORDER — METFORMIN HCL 1000 MG PO TABS: 1000.0000 mg | ORAL_TABLET | Freq: Two times a day (BID) | ORAL | 3 refills | Status: DC

## 2019-01-31 NOTE — Patient Instructions (Signed)
Give us 2-3 business days to get the results of your labs back.   Keep the diet clean and stay active.  Let us know if you need anything. 

## 2019-02-01 LAB — CBC
HCT: 44.2 % (ref 38.5–50.0)
Hemoglobin: 14.8 g/dL (ref 13.2–17.1)
MCH: 27.5 pg (ref 27.0–33.0)
MCHC: 33.5 g/dL (ref 32.0–36.0)
MCV: 82.2 fL (ref 80.0–100.0)
MPV: 12.4 fL (ref 7.5–12.5)
Platelets: 213 10*3/uL (ref 140–400)
RBC: 5.38 10*6/uL (ref 4.20–5.80)
RDW: 13.7 % (ref 11.0–15.0)
WBC: 8.2 10*3/uL (ref 3.8–10.8)

## 2019-02-01 LAB — COMPREHENSIVE METABOLIC PANEL
AG Ratio: 2.2 (calc) (ref 1.0–2.5)
ALT: 33 U/L (ref 9–46)
AST: 21 U/L (ref 10–35)
Albumin: 4.2 g/dL (ref 3.6–5.1)
Alkaline phosphatase (APISO): 95 U/L (ref 35–144)
BUN: 23 mg/dL (ref 7–25)
CO2: 26 mmol/L (ref 20–32)
Calcium: 9.7 mg/dL (ref 8.6–10.3)
Chloride: 107 mmol/L (ref 98–110)
Creat: 0.97 mg/dL (ref 0.70–1.33)
Globulin: 1.9 g/dL (calc) (ref 1.9–3.7)
Glucose, Bld: 123 mg/dL — ABNORMAL HIGH (ref 65–99)
Potassium: 4.7 mmol/L (ref 3.5–5.3)
Sodium: 143 mmol/L (ref 135–146)
Total Bilirubin: 0.3 mg/dL (ref 0.2–1.2)
Total Protein: 6.1 g/dL (ref 6.1–8.1)

## 2019-02-01 LAB — LIPID PANEL
Cholesterol: 237 mg/dL — ABNORMAL HIGH (ref <200)
HDL: 35 mg/dL — ABNORMAL LOW (ref >=40)
LDL Cholesterol (Calc): 155 mg/dL (calc) — ABNORMAL HIGH
Non-HDL Cholesterol (Calc): 202 mg/dL (calc) — ABNORMAL HIGH (ref <130)
Total CHOL/HDL Ratio: 6.8 (calc) — ABNORMAL HIGH (ref <5.0)
Triglycerides: 288 mg/dL — ABNORMAL HIGH (ref <150)

## 2019-02-01 LAB — URINALYSIS, MICROSCOPIC ONLY
Bacteria, UA: NONE SEEN /HPF
Hyaline Cast: NONE SEEN /LPF
RBC / HPF: NONE SEEN /HPF (ref 0–2)
Squamous Epithelial / HPF: NONE SEEN /HPF (ref <=5)
WBC, UA: NONE SEEN /HPF (ref 0–5)

## 2019-02-01 LAB — HEMOGLOBIN A1C
Hgb A1c MFr Bld: 7.7 % of total Hgb — ABNORMAL HIGH (ref <5.7)
Mean Plasma Glucose: 174 (calc)
eAG (mmol/L): 9.7 (calc)

## 2019-02-01 LAB — MICROALBUMIN / CREATININE URINE RATIO
Creatinine, Urine: 59 mg/dL (ref 20–320)
Microalb Creat Ratio: 3 mcg/mg creat (ref <30)
Microalb, Ur: 0.2 mg/dL

## 2019-02-01 NOTE — Progress Notes (Signed)
Subjective:   Chief Complaint  Patient presents with  . Diabetes    follow up     Jon Archer is a 57 y.o. male here for follow-up of diabetes.   Last a1c was >9 Patient denies hypoglycemic reactions. He checks his glucose levels intermittently.  Patient does not require insulin.   Medications include: Metformin 1000 mg bid, Amaryl 2 mg bid, Farxiga 5 mg/d Exercise: Some walking Diet could be better. Has not had eye exam recently 2/2 pandemic  Hypertension Patient presents for hypertension follow up. He does not routinely monitor home blood pressures. He is compliant with medication- lisinopril 10 mg/d. Patient has these side effects of medication: none Diet and exercise as above.   Over past 1-2 weeks, has been having incomplete emptying and some urgency. No pain, bleeding, discharge,  injury, fevers, abd pain.   Past Medical History:  Diagnosis Date  . Carpal tunnel syndrome, bilateral   . Diabetes mellitus without complication ([place])   . Hyperlipidemia   . Hypertension      Related testing: Date of retinal exam: Due Pneumovax: done Flu Shot: not done  Review of Systems: Pulmonary:  No SOB Cardiovascular:  No chest pain  Objective:  BP 130/76 (BP Location: Left Arm, Patient Position: Sitting, Cuff Size: Normal)   Pulse 74   Temp 97.8 F (36.6 C) (Temporal)   Resp 18   Wt 253 lb (114.8 kg)   SpO2 100%   BMI 36.30 kg/m  General:  Well developed, well nourished, in no apparent distress Skin:  Warm, no pallor or diaphoresis Head:  Normocephalic, atraumatic Eyes:  Pupils equal and round, sclera anicteric without injection  Lungs:  CTAB, no access msc use Cardio:  RRR, no bruits, no LE edema Musculoskeletal:  Symmetrical muscle groups noted without atrophy or deformity Abd: S, NT, ND, BS+ Psych: Age appropriate judgment and insight  Assessment:   Controlled type 2 diabetes mellitus without complication, without long-term current use of insulin (HCC)  - Plan: metFORMIN (GLUCOPHAGE) 1000 MG tablet, CBC, Comp Met (CMET), HgB A1c, Lipid Profile, Urine Microalbumin w/creat. ratio, Urine Microscopic Only, CANCELED: CBC, CANCELED: Comp Met (CMET), CANCELED: Urine Microalbumin w/creat. ratio, CANCELED: HgB A1c, CANCELED: Lipid Profile, CANCELED: Urine Microscopic Only  Essential hypertension, benign  Urinary urgency   Plan:   Cont meds. Counseled on diet and exercise. Pt has had UTI's in past, stated it feels similar. Will tx w 7 d of Bactrim, ck urine.  F/u in 3-6 mo. The patient voiced understanding and agreement to the plan.  [place], DO 02/01/19 12:07 PM

## 2019-02-03 ENCOUNTER — Other Ambulatory Visit: Payer: Self-pay | Admitting: Family Medicine

## 2019-03-03 ENCOUNTER — Other Ambulatory Visit: Payer: Self-pay | Admitting: Family Medicine

## 2019-03-13 ENCOUNTER — Telehealth: Payer: Self-pay | Admitting: *Deleted

## 2019-03-13 NOTE — Telephone Encounter (Signed)
Prior auth approved from 03/12/19-03/11/2020  Key: [id number]

## 2019-03-17 ENCOUNTER — Other Ambulatory Visit: Payer: Self-pay | Admitting: Family Medicine

## 2019-03-17 DIAGNOSIS — E119 Type 2 diabetes mellitus without complications: Secondary | ICD-10-CM

## 2019-03-24 ENCOUNTER — Other Ambulatory Visit: Payer: Self-pay | Admitting: Family Medicine

## 2019-03-24 DIAGNOSIS — I10 Essential (primary) hypertension: Secondary | ICD-10-CM

## 2019-04-14 ENCOUNTER — Other Ambulatory Visit: Payer: Self-pay | Admitting: Family Medicine

## 2019-04-14 DIAGNOSIS — J302 Other seasonal allergic rhinitis: Secondary | ICD-10-CM

## 2019-05-19 ENCOUNTER — Other Ambulatory Visit: Payer: Self-pay | Admitting: Family Medicine

## 2019-06-25 ENCOUNTER — Other Ambulatory Visit: Payer: Self-pay | Admitting: Family Medicine

## 2019-06-25 DIAGNOSIS — E119 Type 2 diabetes mellitus without complications: Secondary | ICD-10-CM

## 2019-07-09 ENCOUNTER — Other Ambulatory Visit: Payer: Self-pay | Admitting: Family Medicine

## 2019-07-09 DIAGNOSIS — J302 Other seasonal allergic rhinitis: Secondary | ICD-10-CM

## 2019-07-16 ENCOUNTER — Other Ambulatory Visit: Payer: Self-pay | Admitting: Family Medicine

## 2019-07-16 DIAGNOSIS — I10 Essential (primary) hypertension: Secondary | ICD-10-CM

## 2019-08-01 ENCOUNTER — Encounter: Payer: Self-pay | Admitting: Family Medicine

## 2019-08-01 ENCOUNTER — Ambulatory Visit (INDEPENDENT_AMBULATORY_CARE_PROVIDER_SITE_OTHER): Payer: 59 | Admitting: Family Medicine

## 2019-08-01 ENCOUNTER — Other Ambulatory Visit: Payer: Self-pay

## 2019-08-01 ENCOUNTER — Other Ambulatory Visit: Payer: Self-pay | Admitting: Family Medicine

## 2019-08-01 VITALS — BP 132/84 | HR 67 | Temp 95.7°F | Ht 69.0 in | Wt 247.0 lb

## 2019-08-01 DIAGNOSIS — E1169 Type 2 diabetes mellitus with other specified complication: Secondary | ICD-10-CM | POA: Diagnosis not present

## 2019-08-01 DIAGNOSIS — E669 Obesity, unspecified: Secondary | ICD-10-CM | POA: Diagnosis not present

## 2019-08-01 DIAGNOSIS — Z0000 Encounter for general adult medical examination without abnormal findings: Secondary | ICD-10-CM | POA: Diagnosis not present

## 2019-08-01 LAB — TSH: TSH: 2.11 u[IU]/mL (ref 0.35–4.50)

## 2019-08-01 LAB — CBC
HCT: 41.8 % (ref 39.0–52.0)
Hemoglobin: 14.3 g/dL (ref 13.0–17.0)
MCHC: 34.2 g/dL (ref 30.0–36.0)
MCV: 80.8 fl (ref 78.0–100.0)
Platelets: 182 10*3/uL (ref 150.0–400.0)
RBC: 5.18 Mil/uL (ref 4.22–5.81)
RDW: 14.6 % (ref 11.5–15.5)
WBC: 6.1 10*3/uL (ref 4.0–10.5)

## 2019-08-01 LAB — COMPREHENSIVE METABOLIC PANEL
ALT: 25 U/L (ref 0–53)
AST: 16 U/L (ref 0–37)
Albumin: 4.2 g/dL (ref 3.5–5.2)
Alkaline Phosphatase: 92 U/L (ref 39–117)
BUN: 20 mg/dL (ref 6–23)
CO2: 33 mEq/L — ABNORMAL HIGH (ref 19–32)
Calcium: 9.1 mg/dL (ref 8.4–10.5)
Chloride: 103 mEq/L (ref 96–112)
Creatinine, Ser: 0.86 mg/dL (ref 0.40–1.50)
GFR: 91.08 mL/min (ref >60.00)
Glucose, Bld: 178 mg/dL — ABNORMAL HIGH (ref 70–99)
Potassium: 4.7 mEq/L (ref 3.5–5.1)
Sodium: 141 mEq/L (ref 135–145)
Total Bilirubin: 0.4 mg/dL (ref 0.2–1.2)
Total Protein: 6 g/dL (ref 6.0–8.3)

## 2019-08-01 LAB — LIPID PANEL
Cholesterol: 191 mg/dL (ref 0–200)
HDL: 37.4 mg/dL — ABNORMAL LOW (ref >39.00)
LDL Cholesterol: 133 mg/dL — ABNORMAL HIGH (ref 0–99)
NonHDL: 154.06
Total CHOL/HDL Ratio: 5
Triglycerides: 107 mg/dL (ref 0.0–149.0)
VLDL: 21.4 mg/dL (ref 0.0–40.0)

## 2019-08-01 LAB — HEMOGLOBIN A1C: Hgb A1c MFr Bld: 8.4 % — ABNORMAL HIGH (ref 4.6–6.5)

## 2019-08-01 MED ORDER — PIOGLITAZONE HCL 30 MG PO TABS: 30.0000 mg | ORAL_TABLET | Freq: Every day | ORAL | 3 refills | Status: DC

## 2019-08-01 MED ORDER — ROSUVASTATIN CALCIUM 20 MG PO TABS: 20.0000 mg | ORAL_TABLET | Freq: Every day | ORAL | 3 refills | Status: DC

## 2019-08-01 NOTE — Patient Instructions (Addendum)
Give [place] 2-3 business days to get the results of your labs back.   Keep the diet clean and stay active.  If you do not hear anything about your referral in the next 1-2 weeks, call our office and ask for an update.  Aim to do some physical exertion for 150 minutes per week. This is typically divided into 5 days per week, 30 minutes per day. The activity should be enough to get your heart rate up. Anything is better than nothing if you have time constraints.  The new Shingrix vaccine (for shingles) is a 2 shot series. It can make people feel low energy, achy and almost like they have the flu for 48 hours after injection. Please plan accordingly when deciding on when to get this shot. Call our office for a nurse visit appointment to get this. The second shot of the series is less severe regarding the side effects, but it still lasts 48 hours.   Let us know if you need anything.

## 2019-08-01 NOTE — Progress Notes (Signed)
Chief Complaint  Patient presents with  . Annual Exam    Well Male Jon Archer is here for a complete physical.   His last physical was >1 year ago.  Current diet: in general, diet could be better  Current exercise: active around house Weight trend: stable Daytime fatigue? No. Seat belt? Sometimes    Health maintenance Shingrix- No Colonoscopy- Yes Tetanus- Yes HIV- Yes Hep C- Yes   Past Medical History:  Diagnosis Date  . Carpal tunnel syndrome, bilateral   . Diabetes mellitus without complication (HCC)   . Hyperlipidemia   . Hypertension       Past Surgical History:  Procedure Laterality Date  . TONSILLECTOMY      Medications  Current Outpatient Medications on File Prior to Visit  Medication Sig Dispense Refill  . aspirin 81 MG tablet Take 81 mg by mouth daily.    Marland Kitchen CINNAMON PO Take 2 tablets by mouth 2 (two) times daily.    Marland Kitchen FARXIGA 5 MG TABS tablet Take 1 tablet by mouth once daily 30 tablet 0  . fluticasone (FLONASE) 50 MCG/ACT nasal spray Place 1 spray into both nostrils 2 (two) times daily.    Marland Kitchen glimepiride (AMARYL) 2 MG tablet Take 1 tablet by mouth twice daily 180 tablet 0  . GLUCOMANNAN PO Take 2 tablets by mouth 2 (two) times daily.    Marland Kitchen levocetirizine (XYZAL) 5 MG tablet Take 1 tablet by mouth in the evening 90 tablet 0  . lisinopril (ZESTRIL) 10 MG tablet Take 1 tablet by mouth once daily 90 tablet 0  . metFORMIN (GLUCOPHAGE) 1000 MG tablet Take 1 tablet (1,000 mg total) by mouth 2 (two) times daily with a meal. 180 tablet 3    Allergies Allergies  Allergen Reactions  . Peanuts [Peanut Oil] Itching  . Meloxicam Nausea Only    Family History Family History  Problem Relation Age of Onset  . Heart disease Father     Review of Systems: Constitutional:  no fevers Eye:  no recent significant change in vision Ear/Nose/Mouth/Throat:  Ears:  no hearing loss Nose/Mouth/Throat:  no complaints of nasal congestion, no sore  throat Cardiovascular:  no chest pain, no palpitations Respiratory:  no cough and no shortness of breath Gastrointestinal:  no abdominal pain, no change in bowel habits GU:  Male: negative for dysuria, frequency, and incontinence and negative for prostate symptoms Musculoskeletal/Extremities:  no pain, redness, or swelling of the joints Integumentary (Skin/Breast):  No new abnormal skin lesions reported Neurologic:  no headaches Endocrine: No unexpected weight changes Hematologic/Lymphatic:  no abnormal bleeding  Exam BP 132/84 (BP Location: Left Arm, Patient Position: Sitting, Cuff Size: Large)   Pulse 67   Temp (!) 95.7 F (35.4 C) (Temporal)   Ht 5\' 9"  (1.753 m)   Wt 247 lb (112 kg)   SpO2 100%   BMI 36.48 kg/m  General:  well developed, well nourished, in no apparent distress Skin:  no significant moles, warts, or growths Head:  no masses, lesions, or tenderness Eyes:  pupils equal and round, sclera anicteric without injection Ears:  canals without lesions, TMs shiny without retraction, no obvious effusion, no erythema Nose:  nares patent, septum midline, mucosa normal Throat/Pharynx:  lips and gingiva without lesion; tongue and uvula midline; non-inflamed pharynx; no exudates or postnasal drainage Neck: neck supple without adenopathy, thyromegaly, or masses Cardiac: RRR, no bruits, no LE edema Lungs:  clear to auscultation, breath sounds equal bilaterally, no respiratory distress Rectal: Deferred Musculoskeletal:  symmetrical muscle groups noted without atrophy or deformity Neuro:  gait normal; deep tendon reflexes normal and symmetric Psych: well oriented with normal range of affect and appropriate judgment/insight  Assessment and Plan  Well adult exam - Plan: CBC, Comprehensive metabolic panel, Hemoglobin A1c, Lipid panel, TSH  Diabetes mellitus type 2 in obese [facility]) - Plan: Ambulatory referral to Ophthalmology   Well 59 y.o. male. Counseled on diet and  exercise. Counseled on risks and benefits of prostate cancer screening with PSA. The patient agrees to forego testing. Immunizations, labs, and further orders as above. Follow up in 3-6 mo pending above. The patient voiced understanding and agreement to the plan.  [place], DO 08/01/19 7:21 AM

## 2019-10-20 ENCOUNTER — Other Ambulatory Visit: Payer: Self-pay | Admitting: Family Medicine

## 2019-10-20 DIAGNOSIS — E119 Type 2 diabetes mellitus without complications: Secondary | ICD-10-CM

## 2019-10-20 DIAGNOSIS — J302 Other seasonal allergic rhinitis: Secondary | ICD-10-CM

## 2019-10-27 ENCOUNTER — Other Ambulatory Visit: Payer: Self-pay | Admitting: Family Medicine

## 2019-10-27 DIAGNOSIS — E119 Type 2 diabetes mellitus without complications: Secondary | ICD-10-CM

## 2019-11-04 ENCOUNTER — Ambulatory Visit: Payer: 59 | Admitting: Family Medicine

## 2019-11-17 ENCOUNTER — Other Ambulatory Visit: Payer: Self-pay | Admitting: Family Medicine

## 2019-11-17 DIAGNOSIS — I10 Essential (primary) hypertension: Secondary | ICD-10-CM

## 2019-12-24 ENCOUNTER — Other Ambulatory Visit: Payer: Self-pay | Admitting: Family Medicine

## 2020-01-31 ENCOUNTER — Other Ambulatory Visit: Payer: Self-pay | Admitting: Family Medicine

## 2020-01-31 DIAGNOSIS — J302 Other seasonal allergic rhinitis: Secondary | ICD-10-CM

## 2020-02-07 ENCOUNTER — Other Ambulatory Visit: Payer: Self-pay | Admitting: Family Medicine

## 2020-03-01 ENCOUNTER — Other Ambulatory Visit: Payer: Self-pay | Admitting: Family Medicine

## 2020-03-01 DIAGNOSIS — I10 Essential (primary) hypertension: Secondary | ICD-10-CM

## 2020-03-15 ENCOUNTER — Other Ambulatory Visit: Payer: Self-pay | Admitting: Family Medicine

## 2020-03-15 DIAGNOSIS — E119 Type 2 diabetes mellitus without complications: Secondary | ICD-10-CM

## 2020-05-08 ENCOUNTER — Other Ambulatory Visit: Payer: Self-pay | Admitting: Family Medicine

## 2020-05-08 DIAGNOSIS — E119 Type 2 diabetes mellitus without complications: Secondary | ICD-10-CM

## 2020-05-08 DIAGNOSIS — J302 Other seasonal allergic rhinitis: Secondary | ICD-10-CM

## 2020-06-11 ENCOUNTER — Other Ambulatory Visit: Payer: Self-pay | Admitting: Family Medicine

## 2020-06-11 DIAGNOSIS — E119 Type 2 diabetes mellitus without complications: Secondary | ICD-10-CM

## 2020-06-15 ENCOUNTER — Other Ambulatory Visit: Payer: Self-pay | Admitting: Family Medicine

## 2020-06-15 DIAGNOSIS — I10 Essential (primary) hypertension: Secondary | ICD-10-CM

## 2020-06-23 ENCOUNTER — Other Ambulatory Visit: Payer: Self-pay | Admitting: Family Medicine

## 2020-06-23 DIAGNOSIS — I10 Essential (primary) hypertension: Secondary | ICD-10-CM

## 2020-07-05 ENCOUNTER — Encounter: Payer: Self-pay | Admitting: Family Medicine

## 2020-07-05 ENCOUNTER — Other Ambulatory Visit: Payer: Self-pay

## 2020-07-05 ENCOUNTER — Ambulatory Visit (INDEPENDENT_AMBULATORY_CARE_PROVIDER_SITE_OTHER): Payer: 59 | Admitting: Family Medicine

## 2020-07-05 VITALS — BP 140/80 | HR 89 | Temp 98.4°F | Ht 69.5 in | Wt 261.1 lb

## 2020-07-05 DIAGNOSIS — E669 Obesity, unspecified: Secondary | ICD-10-CM

## 2020-07-05 DIAGNOSIS — E1169 Type 2 diabetes mellitus with other specified complication: Secondary | ICD-10-CM | POA: Diagnosis not present

## 2020-07-05 DIAGNOSIS — J0110 Acute frontal sinusitis, unspecified: Secondary | ICD-10-CM | POA: Diagnosis not present

## 2020-07-05 MED ORDER — AMOXICILLIN-POT CLAVULANATE 875-125 MG PO TABS: 1.0000 | ORAL_TABLET | Freq: Two times a day (BID) | ORAL | 0 refills | Status: AC

## 2020-07-05 MED ORDER — GLIMEPIRIDE 4 MG PO TABS: 4.0000 mg | ORAL_TABLET | Freq: Two times a day (BID) | ORAL | 3 refills | Status: DC

## 2020-07-05 MED ORDER — PREDNISONE 20 MG PO TABS
40.0000 mg | ORAL_TABLET | Freq: Every day | ORAL | 0 refills | Status: AC
Start: 2020-07-05 — End: 2020-07-10

## 2020-07-05 NOTE — Patient Instructions (Addendum)
Continue to push fluids, practice good hand hygiene, and cover your mouth if you cough.  If you start having fevers, shaking or shortness of breath, seek immediate care.  OK to take Tylenol 1000 mg (2 extra strength tabs) or 975 mg (3 regular strength tabs) every 6 hours as needed.  Check your sugars around 2-3 times per week. Alternate checking in the morning before you eat, in the afternoon and before bed. Write them down and bring it to your next appointment.   Take 4 mg of Amaryl at night and 2 mg in the morning. If you are having high readings in the evenings, transition to 4 mg twice daily.   Keep the diet clean and stay active.  Check your blood pressures 2-3 times per week, alternating the time of day you check it. If it is high, considering waiting 1-2 minutes and rechecking. If it gets higher, your anxiety is likely creeping up and we should avoid rechecking.    Let us know if you need anything.

## 2020-07-05 NOTE — Progress Notes (Signed)
Chief Complaint  Patient presents with  . Nasal Congestion    Jon Archer here for URI complaints.  Duration: 9 days; getting worse Associated symptoms: sinus congestion, sinus pain, rhinorrhea, itchy watery eyes, chest tightness and coughing Denies: ear pain, ear drainage, sore throat, wheezing, shortness of breath, myalgia and fevers Treatment to date: Netty pot rinse, ibuprofen, Sudafed, Afrin Sick contacts: Yes- spouse   Diabetes Over the past several months, his fasting morning sugars have been in the low 200s.  He is compliant with metformin 1000 mg twice daily, Actos 30 mg daily, and Amaryl 2 mg twice daily.  He does not have any low readings.  Diet is fair.  He is active around the house.  No chest pain or shortness of breath.  Past Medical History:  Diagnosis Date  . Carpal tunnel syndrome, bilateral   . Diabetes mellitus without complication (HCC)   . Hyperlipidemia   . Hypertension     BP 140/80 (BP Location: Left Arm, Patient Position: Sitting, Cuff Size: Normal)   Pulse 89   Temp 98.4 F (36.9 C) (Oral)   Ht 5' 9.5" (1.765 m)   Wt 261 lb 2 oz (118.4 kg)   SpO2 98%   BMI 38.01 kg/m  General: Awake, alert, appears stated age HEENT: AT, [place], ears patent b/l and TM's neg, nares patent w/o discharge, pharynx pink and without exudates, mild tenderness over the maxillary sinuses bilaterally, MMM Neck: No masses or asymmetry Heart: RRR Lungs: CTAB, no accessory muscle use Psych: Age appropriate judgment and insight, normal mood and affect  Acute frontal sinusitis, recurrence not specified - Plan: predniSONE (DELTASONE) 20 MG tablet, amoxicillin-clavulanate (AUGMENTIN) 875-125 MG tablet  Diabetes mellitus type 2 in obese (HCC) - Plan: glimepiride (AMARYL) 4 MG tablet  Morbid obesity (HCC)  1. 5 d pred burst 40 mg/d.  As he has been having worsening symptoms, will start Augmentin.  Continue to push fluids, practice good hand hygiene, cover mouth when coughing.   If starting to experience fevers, shaking, or shortness of breath, seek immediate care. 2.  Chronic, uncontrolled.  Continue metformin 1000 mg twice daily, Actos 30 mg daily, increase nightly dose of Amaryl from 2 mg to 4 mg.  Continue 2 mg in the morning but start checking sugars in the evening.  May need to start taking 4 mg twice daily.  I will see him in 1 month to recheck this.  His blood pressure is also high on recheck.  He will start monitoring at home. Pt voiced understanding and agreement to the plan.  Jilda Roche [place], DO 07/05/20 4:43 PM

## 2020-07-16 ENCOUNTER — Other Ambulatory Visit: Payer: Self-pay | Admitting: Family Medicine

## 2020-08-09 ENCOUNTER — Ambulatory Visit: Payer: 59 | Admitting: Family Medicine

## 2020-08-18 ENCOUNTER — Other Ambulatory Visit: Payer: Self-pay | Admitting: Family Medicine

## 2020-08-18 DIAGNOSIS — J302 Other seasonal allergic rhinitis: Secondary | ICD-10-CM

## 2020-08-23 ENCOUNTER — Ambulatory Visit: Payer: 59 | Admitting: Family Medicine

## 2020-10-04 ENCOUNTER — Other Ambulatory Visit: Payer: Self-pay | Admitting: Family Medicine

## 2020-10-04 DIAGNOSIS — E119 Type 2 diabetes mellitus without complications: Secondary | ICD-10-CM

## 2020-10-31 ENCOUNTER — Other Ambulatory Visit: Payer: Self-pay | Admitting: Family Medicine

## 2020-10-31 DIAGNOSIS — E1169 Type 2 diabetes mellitus with other specified complication: Secondary | ICD-10-CM

## 2020-11-15 ENCOUNTER — Other Ambulatory Visit: Payer: Self-pay | Admitting: Family Medicine

## 2020-11-15 DIAGNOSIS — E1169 Type 2 diabetes mellitus with other specified complication: Secondary | ICD-10-CM

## 2020-11-15 DIAGNOSIS — J302 Other seasonal allergic rhinitis: Secondary | ICD-10-CM

## 2020-11-25 ENCOUNTER — Telehealth: Payer: Self-pay | Admitting: Family Medicine

## 2020-11-25 DIAGNOSIS — E669 Obesity, unspecified: Secondary | ICD-10-CM

## 2020-11-25 DIAGNOSIS — E1169 Type 2 diabetes mellitus with other specified complication: Secondary | ICD-10-CM

## 2020-11-25 MED ORDER — GLIMEPIRIDE 4 MG PO TABS: 4.0000 mg | ORAL_TABLET | Freq: Three times a day (TID) | ORAL | 0 refills | Status: DC

## 2020-11-25 NOTE — Telephone Encounter (Signed)
Pt. Called in and stated that rx that was sent in didn't  have correct quantity of tablets. Pt is supposed to take 1 in am and 2 in pm but rx. only had enough for 1 in am 1 in pm. Pt. Is requesting a refill on medication due to running out.  glimepiride (AMARYL) 4 MG tablet    [facility] [address] [place] - [address]  [address] [place] [address]  Phone:  [phone]

## 2020-11-25 NOTE — Telephone Encounter (Signed)
Updated list and sent in. Patient notified

## 2020-12-02 ENCOUNTER — Other Ambulatory Visit: Payer: Self-pay | Admitting: Family Medicine

## 2021-01-06 ENCOUNTER — Other Ambulatory Visit: Payer: Self-pay | Admitting: Family Medicine

## 2021-01-06 DIAGNOSIS — E119 Type 2 diabetes mellitus without complications: Secondary | ICD-10-CM

## 2021-01-10 ENCOUNTER — Ambulatory Visit (INDEPENDENT_AMBULATORY_CARE_PROVIDER_SITE_OTHER): Payer: 59 | Admitting: Family Medicine

## 2021-01-10 ENCOUNTER — Ambulatory Visit: Payer: 59 | Admitting: Family Medicine

## 2021-01-10 ENCOUNTER — Other Ambulatory Visit: Payer: Self-pay

## 2021-01-10 ENCOUNTER — Encounter: Payer: Self-pay | Admitting: Family Medicine

## 2021-01-10 VITALS — BP 122/82 | HR 77 | Temp 98.3°F | Ht 69.5 in | Wt 258.1 lb

## 2021-01-10 DIAGNOSIS — E669 Obesity, unspecified: Secondary | ICD-10-CM

## 2021-01-10 DIAGNOSIS — E1169 Type 2 diabetes mellitus with other specified complication: Secondary | ICD-10-CM | POA: Diagnosis not present

## 2021-01-10 DIAGNOSIS — B353 Tinea pedis: Secondary | ICD-10-CM

## 2021-01-10 MED ORDER — DAPAGLIFLOZIN PROPANEDIOL 10 MG PO TABS: 10.0000 mg | ORAL_TABLET | Freq: Every day | ORAL | 2 refills | Status: DC

## 2021-01-10 MED ORDER — KETOCONAZOLE 2 % EX CREA: 1.0000 "application " | TOPICAL_CREAM | Freq: Every day | CUTANEOUS | 0 refills | Status: AC

## 2021-01-10 MED ORDER — METFORMIN HCL ER 500 MG PO TB24: 500.0000 mg | ORAL_TABLET | Freq: Every day | ORAL | 5 refills | Status: DC

## 2021-01-10 NOTE — Progress Notes (Signed)
Subjective:   Chief Complaint  Patient presents with   Follow-up    Diabetes     Jon Archer is a 60 y.o. male here for follow-up of diabetes.   Sergio's self monitored glucose range is high 100's.  Patient denies hypoglycemic reactions. He checks his glucose levels 1 time(s) per day. Patient does not require insulin.   Medications include: Amaryl 4 mg tid, Metformin 1000 mg bid, Actos 30 mg/d.  Diet is fair.  Exercise: none No CP or SOB.   Past Medical History:  Diagnosis Date   Carpal tunnel syndrome, bilateral    Diabetes mellitus without complication (HCC)    Hyperlipidemia    Hypertension      Related testing: Retinal exam: Done Pneumovax: Due for PCV20  Objective:  BP 122/82   Pulse 77   Temp 98.3 F (36.8 C) (Oral)   Ht 5' 9.5" (1.765 m)   Wt 258 lb 2 oz (117.1 kg)   SpO2 98%   BMI 37.57 kg/m  General:  Well developed, well nourished, in no apparent distress Skin: Macerated tissue between 4/5th digit on R foot; otherwise warm, no pallor or diaphoresis Head:  Normocephalic, atraumatic Eyes:  Pupils equal and round, sclera anicteric without injection  Lungs:  CTAB, no access msc use Cardio:  RRR, no bruits, no LE edema Musculoskeletal:  Symmetrical muscle groups noted without atrophy or deformity Neuro:  Sensation intact to pinprick on feet Psych: Age appropriate judgment and insight  Assessment:   Diabetes mellitus type 2 in obese (HCC) - Plan: Comprehensive metabolic panel, CBC, Hemoglobin A1c, Lipid panel, Microalbumin / creatinine urine ratio, metFORMIN (GLUCOPHAGE XR) 500 MG 24 hr tablet, dapagliflozin propanediol (FARXIGA) 10 MG TABS tablet  Tinea pedis of right foot - Plan: ketoconazole (NIZORAL) 2 % cream   Plan:   Chronic, uncontrolled. Change Metformin to XR given gas/fecal urgency. Cont Actos 30 mg/d, Amaryl 4 mg TID. Add Farxiga 10 mg/d. Counseled on diet and exercise. Cream qd for 6 weeks for athlete's foot.  F/u in 3 mo. The  patient voiced understanding and agreement to the plan.  Jilda Roche [place], DO 01/10/21 2:39 PM

## 2021-01-10 NOTE — Patient Instructions (Signed)
Give [place] 2-3 business days to get the results of your labs back.   Keep the diet clean and stay active.  Please consider the pneumonia vaccine.   Let us know if you need anything.

## 2021-01-11 LAB — COMPREHENSIVE METABOLIC PANEL
ALT: 28 U/L (ref 0–53)
AST: 22 U/L (ref 0–37)
Albumin: 4.4 g/dL (ref 3.5–5.2)
Alkaline Phosphatase: 100 U/L (ref 39–117)
BUN: 30 mg/dL — ABNORMAL HIGH (ref 6–23)
CO2: 29 mEq/L (ref 19–32)
Calcium: 9.8 mg/dL (ref 8.4–10.5)
Chloride: 104 mEq/L (ref 96–112)
Creatinine, Ser: 0.92 mg/dL (ref 0.40–1.50)
GFR: 90.57 mL/min (ref >60.00)
Glucose, Bld: 192 mg/dL — ABNORMAL HIGH (ref 70–99)
Potassium: 5.2 mEq/L — ABNORMAL HIGH (ref 3.5–5.1)
Sodium: 139 mEq/L (ref 135–145)
Total Bilirubin: 0.3 mg/dL (ref 0.2–1.2)
Total Protein: 6.5 g/dL (ref 6.0–8.3)

## 2021-01-11 LAB — CBC
HCT: 45.8 % (ref 39.0–52.0)
Hemoglobin: 15.1 g/dL (ref 13.0–17.0)
MCHC: 33.1 g/dL (ref 30.0–36.0)
MCV: 82.9 fl (ref 78.0–100.0)
Platelets: 209 10*3/uL (ref 150.0–400.0)
RBC: 5.52 Mil/uL (ref 4.22–5.81)
RDW: 14.2 % (ref 11.5–15.5)
WBC: 8.3 10*3/uL (ref 4.0–10.5)

## 2021-01-11 LAB — MICROALBUMIN / CREATININE URINE RATIO
Creatinine,U: 80.9 mg/dL
Microalb Creat Ratio: 1 mg/g (ref 0.0–30.0)
Microalb, Ur: 0.8 mg/dL (ref 0.0–1.9)

## 2021-01-11 LAB — LIPID PANEL
Cholesterol: 228 mg/dL — ABNORMAL HIGH (ref 0–200)
HDL: 43.7 mg/dL (ref >39.00)
LDL Cholesterol: 155 mg/dL — ABNORMAL HIGH (ref 0–99)
NonHDL: 184.1
Total CHOL/HDL Ratio: 5
Triglycerides: 146 mg/dL (ref 0.0–149.0)
VLDL: 29.2 mg/dL (ref 0.0–40.0)

## 2021-01-11 LAB — HEMOGLOBIN A1C: Hgb A1c MFr Bld: 9.4 % — ABNORMAL HIGH (ref 4.6–6.5)

## 2021-01-13 ENCOUNTER — Other Ambulatory Visit: Payer: Self-pay | Admitting: Family Medicine

## 2021-01-14 ENCOUNTER — Other Ambulatory Visit: Payer: Self-pay | Admitting: Family Medicine

## 2021-01-14 DIAGNOSIS — E669 Obesity, unspecified: Secondary | ICD-10-CM

## 2021-01-14 MED ORDER — METFORMIN HCL ER 500 MG PO TB24: 1000.0000 mg | ORAL_TABLET | Freq: Two times a day (BID) | ORAL | 5 refills | Status: DC

## 2021-01-14 MED ORDER — PIOGLITAZONE HCL 45 MG PO TABS: 45.0000 mg | ORAL_TABLET | Freq: Every day | ORAL | 5 refills | Status: DC

## 2021-01-17 ENCOUNTER — Encounter: Payer: Self-pay | Admitting: Family Medicine

## 2021-01-17 ENCOUNTER — Other Ambulatory Visit: Payer: Self-pay

## 2021-01-17 ENCOUNTER — Telehealth (INDEPENDENT_AMBULATORY_CARE_PROVIDER_SITE_OTHER): Payer: 59 | Admitting: Family Medicine

## 2021-01-17 DIAGNOSIS — R6889 Other general symptoms and signs: Secondary | ICD-10-CM | POA: Diagnosis not present

## 2021-01-17 MED ORDER — OSELTAMIVIR PHOSPHATE 75 MG PO CAPS: 75.0000 mg | ORAL_CAPSULE | Freq: Two times a day (BID) | ORAL | 0 refills | Status: AC

## 2021-01-17 NOTE — Progress Notes (Signed)
Chief Complaint  Patient presents with   Cough    Covid test today 01/17/21 was Negative   Fever   Generalized Body Aches   Sinus Problem    Jon Archer here for URI complaints. Due to COVID-19 pandemic, we are interacting via web portal for an electronic face-to-face visit. I verified patient's ID using 2 identifiers. Patient agreed to proceed with visit via this method. Patient is at home, I am at office. Patient and I are present for visit.   Duration: 2 days  Associated symptoms: Fever (101 F), sinus congestion, rhinorrhea, myalgia, and coughing Denies: sinus pain, itchy watery eyes, ear pain, ear drainage, sore throat, wheezing, shortness of breath, and N/V/D, loss of taste/smell Treatment to date: Sudafed, ibuprofen, Afrin Sick contacts: No Tested neg for covid.   Past Medical History:  Diagnosis Date   Carpal tunnel syndrome, bilateral    Diabetes mellitus without complication (HCC)    Hyperlipidemia    Hypertension     Objective No conversational dyspnea Age appropriate judgment and insight Nml affect and mood  Flu-like symptoms - Plan: oseltamivir (TAMIFLU) 75 MG capsule  Will empirically tx for flu. Politely declined cough medicine. Will send me message if no improvement in the next few days.  Continue to push fluids, practice good hand hygiene, cover mouth when coughing. F/u prn. If starting to experience irreplaceable fluid loss, shaking, or shortness of breath, seek immediate care. Pt voiced understanding and agreement to the plan.  Jilda Roche [place], DO 01/17/21 1:33 PM

## 2021-01-29 ENCOUNTER — Other Ambulatory Visit: Payer: Self-pay | Admitting: Family Medicine

## 2021-01-29 DIAGNOSIS — I10 Essential (primary) hypertension: Secondary | ICD-10-CM

## 2021-03-01 ENCOUNTER — Other Ambulatory Visit: Payer: Self-pay | Admitting: Family Medicine

## 2021-03-01 DIAGNOSIS — J302 Other seasonal allergic rhinitis: Secondary | ICD-10-CM

## 2021-03-01 DIAGNOSIS — E1169 Type 2 diabetes mellitus with other specified complication: Secondary | ICD-10-CM

## 2021-03-02 ENCOUNTER — Ambulatory Visit: Payer: Self-pay | Admitting: Family Medicine

## 2021-04-12 ENCOUNTER — Ambulatory Visit: Payer: 59 | Admitting: Family Medicine

## 2021-05-11 ENCOUNTER — Other Ambulatory Visit: Payer: Self-pay | Admitting: Family Medicine

## 2021-05-11 DIAGNOSIS — I10 Essential (primary) hypertension: Secondary | ICD-10-CM

## 2021-06-02 ENCOUNTER — Other Ambulatory Visit: Payer: Self-pay | Admitting: Family Medicine

## 2021-06-02 DIAGNOSIS — E1169 Type 2 diabetes mellitus with other specified complication: Secondary | ICD-10-CM

## 2021-07-04 ENCOUNTER — Other Ambulatory Visit: Payer: Self-pay | Admitting: Family Medicine

## 2021-07-04 DIAGNOSIS — J302 Other seasonal allergic rhinitis: Secondary | ICD-10-CM

## 2021-08-10 ENCOUNTER — Other Ambulatory Visit: Payer: Self-pay | Admitting: Family Medicine

## 2021-08-10 DIAGNOSIS — E669 Obesity, unspecified: Secondary | ICD-10-CM

## 2021-08-21 ENCOUNTER — Other Ambulatory Visit: Payer: Self-pay | Admitting: Family Medicine

## 2021-08-21 DIAGNOSIS — E1169 Type 2 diabetes mellitus with other specified complication: Secondary | ICD-10-CM

## 2021-08-27 ENCOUNTER — Other Ambulatory Visit: Payer: Self-pay | Admitting: Family Medicine

## 2021-08-27 DIAGNOSIS — I10 Essential (primary) hypertension: Secondary | ICD-10-CM

## 2021-09-02 ENCOUNTER — Other Ambulatory Visit: Payer: Self-pay | Admitting: Family Medicine

## 2021-09-02 DIAGNOSIS — E1169 Type 2 diabetes mellitus with other specified complication: Secondary | ICD-10-CM

## 2021-09-02 MED ORDER — DAPAGLIFLOZIN PROPANEDIOL 10 MG PO TABS: 10.0000 mg | ORAL_TABLET | Freq: Every day | ORAL | 2 refills | Status: DC

## 2021-09-20 ENCOUNTER — Encounter: Payer: Self-pay | Admitting: *Deleted

## 2021-09-20 ENCOUNTER — Other Ambulatory Visit: Payer: Self-pay | Admitting: Family Medicine

## 2021-09-20 DIAGNOSIS — E1169 Type 2 diabetes mellitus with other specified complication: Secondary | ICD-10-CM

## 2021-09-27 ENCOUNTER — Other Ambulatory Visit: Payer: Self-pay | Admitting: Family Medicine

## 2021-09-27 DIAGNOSIS — J302 Other seasonal allergic rhinitis: Secondary | ICD-10-CM

## 2021-10-03 ENCOUNTER — Other Ambulatory Visit: Payer: Self-pay | Admitting: Family Medicine

## 2021-10-03 DIAGNOSIS — E669 Obesity, unspecified: Secondary | ICD-10-CM

## 2021-10-12 ENCOUNTER — Other Ambulatory Visit: Payer: Self-pay | Admitting: Family Medicine

## 2021-10-12 DIAGNOSIS — E1169 Type 2 diabetes mellitus with other specified complication: Secondary | ICD-10-CM

## 2021-10-14 ENCOUNTER — Ambulatory Visit (HOSPITAL_BASED_OUTPATIENT_CLINIC_OR_DEPARTMENT_OTHER)
Admission: RE | Admit: 2021-10-14 | Discharge: 2021-10-14 | Disposition: A | Discharge status: Home / Self Care | Payer: 59 | Source: Ambulatory Visit | Attending: Family Medicine | Admitting: Family Medicine

## 2021-10-14 ENCOUNTER — Encounter: Payer: Self-pay | Admitting: Family Medicine

## 2021-10-14 ENCOUNTER — Ambulatory Visit (INDEPENDENT_AMBULATORY_CARE_PROVIDER_SITE_OTHER): Payer: 59 | Admitting: Family Medicine

## 2021-10-14 VITALS — BP 136/76 | HR 75 | Resp 18 | Ht 69.5 in | Wt 258.0 lb

## 2021-10-14 DIAGNOSIS — E1169 Type 2 diabetes mellitus with other specified complication: Secondary | ICD-10-CM

## 2021-10-14 DIAGNOSIS — R0789 Other chest pain: Secondary | ICD-10-CM | POA: Diagnosis present

## 2021-10-14 DIAGNOSIS — I10 Essential (primary) hypertension: Secondary | ICD-10-CM

## 2021-10-14 DIAGNOSIS — E669 Obesity, unspecified: Secondary | ICD-10-CM

## 2021-10-14 LAB — COMPREHENSIVE METABOLIC PANEL
ALT: 20 U/L (ref 0–53)
AST: 17 U/L (ref 0–37)
Albumin: 4.1 g/dL (ref 3.5–5.2)
Alkaline Phosphatase: 92 U/L (ref 39–117)
BUN: 23 mg/dL (ref 6–23)
CO2: 31 mEq/L (ref 19–32)
Calcium: 8.9 mg/dL (ref 8.4–10.5)
Chloride: 105 mEq/L (ref 96–112)
Creatinine, Ser: 0.87 mg/dL (ref 0.40–1.50)
GFR: 93.45 mL/min (ref >60.00)
Glucose, Bld: 158 mg/dL — ABNORMAL HIGH (ref 70–99)
Potassium: 4.8 mEq/L (ref 3.5–5.1)
Sodium: 144 mEq/L (ref 135–145)
Total Bilirubin: 0.4 mg/dL (ref 0.2–1.2)
Total Protein: 6 g/dL (ref 6.0–8.3)

## 2021-10-14 LAB — LIPID PANEL
Cholesterol: 198 mg/dL (ref 0–200)
HDL: 40.7 mg/dL (ref >39.00)
LDL Cholesterol: 132 mg/dL — ABNORMAL HIGH (ref 0–99)
NonHDL: 157.05
Total CHOL/HDL Ratio: 5
Triglycerides: 125 mg/dL (ref 0.0–149.0)
VLDL: 25 mg/dL (ref 0.0–40.0)

## 2021-10-14 LAB — HEMOGLOBIN A1C: Hgb A1c MFr Bld: 9.6 % — ABNORMAL HIGH (ref 4.6–6.5)

## 2021-10-14 MED ORDER — LISINOPRIL 20 MG PO TABS: 20.0000 mg | ORAL_TABLET | Freq: Every day | ORAL | 3 refills | Status: DC

## 2021-10-14 MED ORDER — GLIMEPIRIDE 4 MG PO TABS: 4.0000 mg | ORAL_TABLET | Freq: Two times a day (BID) | ORAL | 2 refills | Status: DC

## 2021-10-14 MED ORDER — OZEMPIC (0.25 OR 0.5 MG/DOSE) 2 MG/1.5ML ~~LOC~~ SOPN: PEN_INJECTOR | SUBCUTANEOUS | 1 refills | Status: AC

## 2021-10-14 NOTE — Patient Instructions (Addendum)
Give [place] 2-3 business days to get the results of your labs back.   We will be in touch regarding your X-ray results.   Let me know if there are cost issues.  I recommend getting the flu shot in mid October. This suggestion would change if the CDC comes out with a different recommendation.   Keep the diet clean and stay active.  Let us know if you need anything.  Trapezius stretches/exercises Do exercises exactly as told by your health care provider and adjust them as directed. It is normal to feel mild stretching, pulling, tightness, or discomfort as you do these exercises, but you should stop right away if you feel sudden pain or your pain gets worse.   Stretching and range of motion exercises These exercises warm up your muscles and joints and improve the movement and flexibility of your shoulder. These exercises can also help to relieve pain, numbness, and tingling. If you are unable to do any of the following for any reason, do not further attempt to do it.   Exercise A: Flexion, standing     Stand and hold a broomstick, a cane, or a similar object. Place your hands a little more than shoulder-width apart on the object. Your left / right hand should be palm-up, and your other hand should be palm-down. Push the stick to raise your left / right arm out to your side and then over your head. Use your other hand to help move the stick. Stop when you feel a stretch in your shoulder, or when you reach the angle that is recommended by your health care provider. Avoid shrugging your shoulder while you raise your arm. Keep your shoulder blade tucked down toward your spine. Hold for 30 seconds. Slowly return to the starting position. Repeat 2 times. Complete this exercise 3 times per week.  Exercise B: Abduction, supine     Lie on your back and hold a broomstick, a cane, or a similar object. Place your hands a little more than shoulder-width apart on the object. Your left / right hand should  be palm-up, and your other hand should be palm-down. Push the stick to raise your left / right arm out to your side and then over your head. Use your other hand to help move the stick. Stop when you feel a stretch in your shoulder, or when you reach the angle that is recommended by your health care provider. Avoid shrugging your shoulder while you raise your arm. Keep your shoulder blade tucked down toward your spine. Hold for 30 seconds. Slowly return to the starting position. Repeat 2 times. Complete this exercise 3 times per week.  Exercise C: Flexion, active-assisted     Lie on your back. You may bend your knees for comfort. Hold a broomstick, a cane, or a similar object. Place your hands about shoulder-width apart on the object. Your palms should face toward your feet. Raise the stick and move your arms over your head and behind your head, toward the floor. Use your healthy arm to help your left / right arm move farther. Stop when you feel a gentle stretch in your shoulder, or when you reach the angle where your health care provider tells you to stop. Hold for 30 seconds. Slowly return to the starting position. Repeat 2 times. Complete this exercise 3 times per week.  Exercise D: External rotation and abduction     Stand in a door frame with one of your feet slightly in  front of the other. This is called a staggered stance. Choose one of the following positions as told by your health care provider: Place your hands and forearms on the door frame above your head. Place your hands and forearms on the door frame at the height of your head. Place your hands on the door frame at the height of your elbows. Slowly move your weight onto your front foot until you feel a stretch across your chest and in the front of your shoulders. Keep your head and chest upright and keep your abdominal muscles tight. Hold for 30 seconds. To release the stretch, shift your weight to your back foot. Repeat 2  times. Complete this stretch 3 times per week.  Strengthening exercises These exercises build strength and endurance in your shoulder. Endurance is the ability to use your muscles for a long time, even after your muscles get tired. Exercise E: Scapular depression and adduction  Sit on a stable chair. Support your arms in front of you with pillows, armrests, or a tabletop. Keep your elbows in line with the sides of your body. Gently move your shoulder blades down toward your middle back. Relax the muscles on the tops of your shoulders and in the back of your neck. Hold for 3 seconds. Slowly release the tension and relax your muscles completely before doing this exercise again. Repeat for a total of 10 repetitions. After you have practiced this exercise, try doing the exercise without the arm support. Then, try the exercise while standing instead of sitting. Repeat 2 times. Complete this exercise 3 times per week.  Exercise F: Shoulder abduction, isometric     Stand or sit about 4-6 inches (10-15 cm) from a wall with your left / right side facing the wall. Bend your left / right elbow and gently press your elbow against the wall. Increase the pressure slowly until you are pressing as hard as you can without shrugging your shoulder. Hold for 3 seconds. Slowly release the tension and relax your muscles completely. Repeat for a total of 10 repetitions. Repeat 2 times. Complete this exercise 3 times per week.  Exercise G: Shoulder flexion, isometric     Stand or sit about 4-6 inches (10-15 cm) away from a wall with your left / right side facing the wall. Keep your left / right elbow straight and gently press the top of your fist against the wall. Increase the pressure slowly until you are pressing as hard as you can without shrugging your shoulder. Hold for 10-15 seconds. Slowly release the tension and relax your muscles completely. Repeat for a total of 10 repetitions. Repeat 2 times.  Complete this exercise 3 times per week.  Exercise H: Internal rotation     Sit in a stable chair without armrests, or stand. Secure an exercise band at your left / right side, at elbow height. Place a soft object, such as a folded towel or a small pillow, under your left / right upper arm so your elbow is a few inches (about 8 cm) away from your side. Hold the end of the exercise band so the band stretches. Keeping your elbow pressed against the soft object under your arm, move your forearm across your body toward your abdomen. Keep your body steady so the movement is only coming from your shoulder. Hold for 3 seconds. Slowly return to the starting position. Repeat for a total of 10 repetitions. Repeat 2 times. Complete this exercise 3 times per week.  Exercise  I: External rotation     Sit in a stable chair without armrests, or stand. Secure an exercise band at your left / right side, at elbow height. Place a soft object, such as a folded towel or a small pillow, under your left / right upper arm so your elbow is a few inches (about 8 cm) away from your side. Hold the end of the exercise band so the band stretches. Keeping your elbow pressed against the soft object under your arm, move your forearm out, away from your abdomen. Keep your body steady so the movement is only coming from your shoulder. Hold for 3 seconds. Slowly return to the starting position. Repeat for a total of 10 repetitions. Repeat 2 times. Complete this exercise 3 times per week. Exercise J: Shoulder extension  Sit in a stable chair without armrests, or stand. Secure an exercise band to a stable object in front of you so the band is at shoulder height. Hold one end of the exercise band in each hand. Your palms should face each other. Straighten your elbows and lift your hands up to shoulder height. Step back, away from the secured end of the exercise band, until the band stretches. Squeeze your shoulder blades  together and pull your hands down to the sides of your thighs. Stop when your hands are straight down by your sides. Do not let your hands go behind your body. Hold for 3 seconds. Slowly return to the starting position. Repeat for a total of 10 repetitions. Repeat 2 times. Complete this exercise 3 times per week.  Exercise K: Shoulder extension, prone     Lie on your abdomen on a firm surface so your left / right arm hangs over the edge. Hold a 5 lb weight in your hand so your palm faces in toward your body. Your arm should be straight. Squeeze your shoulder blade down toward the middle of your back. Slowly raise your arm behind you, up to the height of the surface that you are lying on. Keep your arm straight. Hold for 3 seconds. Slowly return to the starting position and relax your muscles. Repeat for a total of 10 repetitions. Repeat 2 times. Complete this exercise 3 times per week.   Exercise L: Horizontal abduction, prone  Lie on your abdomen on a firm surface so your left / right arm hangs over the edge. Hold a 5 lb weight in your hand so your palm faces toward your feet. Your arm should be straight. Squeeze your shoulder blade down toward the middle of your back. Bend your elbow so your hand moves up, until your elbow is bent to an "L" shape (90 degrees). With your elbow bent, slowly move your forearm forward and up. Raise your hand up to the height of the surface that you are lying on. Your upper arm should not move, and your elbow should stay bent. At the top of the movement, your palm should face the floor. Hold for 3 seconds. Slowly return to the starting position and relax your muscles. Repeat for a total of 10 repetitions. Repeat 2 times. Complete this exercise 3 times per week.  Exercise M: Horizontal abduction, standing  Sit on a stable chair, or stand. Secure an exercise band to a stable object in front of you so the band is at shoulder height. Hold one end of the  exercise band in each hand. Straighten your elbows and lift your hands straight in front of you, up to shoulder  height. Your palms should face down, toward the floor. Step back, away from the secured end of the exercise band, until the band stretches. Move your arms out to your sides, and keep your arms straight. Hold for 3 seconds. Slowly return to the starting position. Repeat for a total of 10 repetitions. Repeat 2 times. Complete this exercise 3 times per week.  Exercise N: Scapular retraction and elevation  Sit on a stable chair, or stand. Secure an exercise band to a stable object in front of you so the band is at shoulder height. Hold one end of the exercise band in each hand. Your palms should face each other. Sit in a stable chair without armrests, or stand. Step back, away from the secured end of the exercise band, until the band stretches. Squeeze your shoulder blades together and lift your hands over your head. Keep your elbows straight. Hold for 3 seconds. Slowly return to the starting position. Repeat for a total of 10 repetitions. Repeat 2 times. Complete this exercise 3 times per week.  This information is not intended to replace advice given to you by your health care provider. Make sure you discuss any questions you have with your health care provider. Document Released: 02/13/2005 Document Revised: 10/21/2015 Document Reviewed: 12/31/2014 Elsevier Interactive Patient Education  2017 [employer].
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
## 2021-10-19 ENCOUNTER — Encounter: Payer: Self-pay | Admitting: Family Medicine

## 2021-10-23 ENCOUNTER — Other Ambulatory Visit: Payer: Self-pay | Admitting: Family Medicine

## 2021-10-23 DIAGNOSIS — E1169 Type 2 diabetes mellitus with other specified complication: Secondary | ICD-10-CM

## 2021-11-07 ENCOUNTER — Other Ambulatory Visit: Payer: Self-pay | Admitting: Family Medicine

## 2021-11-07 DIAGNOSIS — E1169 Type 2 diabetes mellitus with other specified complication: Secondary | ICD-10-CM

## 2021-11-14 ENCOUNTER — Other Ambulatory Visit: Payer: Self-pay | Admitting: Family Medicine

## 2021-11-14 DIAGNOSIS — J302 Other seasonal allergic rhinitis: Secondary | ICD-10-CM

## 2021-11-16 ENCOUNTER — Telehealth: Payer: Self-pay | Admitting: Family Medicine

## 2021-11-16 DIAGNOSIS — J302 Other seasonal allergic rhinitis: Secondary | ICD-10-CM

## 2021-11-16 MED ORDER — LEVOCETIRIZINE DIHYDROCHLORIDE 5 MG PO TABS: 5.0000 mg | ORAL_TABLET | Freq: Every evening | ORAL | 0 refills | Status: DC

## 2021-11-16 NOTE — Telephone Encounter (Signed)
Patient called to see why his levocetirizine (XYZAL) 5 MG tablet [[id number]] was denied. Please call to advise.

## 2021-11-16 NOTE — Telephone Encounter (Signed)
Rx sent 

## 2021-11-23 ENCOUNTER — Other Ambulatory Visit: Payer: Self-pay | Admitting: Family Medicine

## 2021-11-23 DIAGNOSIS — E669 Obesity, unspecified: Secondary | ICD-10-CM

## 2021-12-05 ENCOUNTER — Ambulatory Visit: Payer: 59 | Admitting: Family Medicine

## 2021-12-09 ENCOUNTER — Other Ambulatory Visit: Payer: Self-pay | Admitting: Family Medicine

## 2021-12-09 DIAGNOSIS — E1169 Type 2 diabetes mellitus with other specified complication: Secondary | ICD-10-CM

## 2021-12-16 ENCOUNTER — Other Ambulatory Visit: Payer: Self-pay | Admitting: Family Medicine

## 2021-12-16 DIAGNOSIS — E1169 Type 2 diabetes mellitus with other specified complication: Secondary | ICD-10-CM

## 2022-01-06 ENCOUNTER — Ambulatory Visit: Payer: 59 | Admitting: Family Medicine

## 2022-01-09 ENCOUNTER — Encounter: Payer: Self-pay | Admitting: Family Medicine

## 2022-01-09 ENCOUNTER — Ambulatory Visit (INDEPENDENT_AMBULATORY_CARE_PROVIDER_SITE_OTHER): Payer: Commercial Managed Care - HMO | Admitting: Family Medicine

## 2022-01-09 VITALS — BP 132/70 | HR 74 | Temp 98.1°F | Ht 69.5 in | Wt 246.1 lb

## 2022-01-09 DIAGNOSIS — E669 Obesity, unspecified: Secondary | ICD-10-CM

## 2022-01-09 DIAGNOSIS — E1169 Type 2 diabetes mellitus with other specified complication: Secondary | ICD-10-CM | POA: Diagnosis not present

## 2022-01-09 DIAGNOSIS — J302 Other seasonal allergic rhinitis: Secondary | ICD-10-CM

## 2022-01-09 MED ORDER — MONTELUKAST SODIUM 10 MG PO TABS: 10.0000 mg | ORAL_TABLET | Freq: Every day | ORAL | 3 refills | Status: DC

## 2022-01-09 NOTE — Progress Notes (Signed)
Subjective:   Chief Complaint  Patient presents with   Follow-up    Jon Archer is a 61 y.o. male here for follow-up of diabetes.   [place]'s self monitored glucose range is 130-150's.  Patient denies hypoglycemic reactions. He checks his glucose levels 1 time(s) per day. Patient does not require insulin.   Medications include: Farxiga 10 mg/d, Actos 45 mg/d, Metformin XR 1000 mg bid, Amaryl 4 mg in AM, 8 mg in PM.  Diet is improving.  Exercise: limited  Seasonal allergies Patient has a history of seasonal allergies taking Flonase as needed and Xyzal nearly daily.  They do help but he will sometimes have flares, particularly during pollen season.  No coughing, wheezing, fevers, or shortness of breath.  Past Medical History:  Diagnosis Date   Carpal tunnel syndrome, bilateral    Diabetes mellitus without complication (HCC)    Hyperlipidemia    Hypertension      Related testing: Retinal exam: Due Pneumovax: done  Objective:  BP 132/70 (BP Location: Left Arm, Patient Position: Sitting, Cuff Size: Normal)   Pulse 74   Temp 98.1 F (36.7 C) (Oral)   Ht 5' 9.5" (1.765 m)   Wt 246 lb 2 oz (111.6 kg)   SpO2 99%   BMI 35.83 kg/m  General:  Well developed, well nourished, in no apparent distress Skin:  Warm, no pallor or diaphoresis Head:  Normocephalic, atraumatic Eyes:  Pupils equal and round, sclera anicteric without injection  Lungs:  CTAB, no access msc use Cardio:  RRR, no bruits, no LE edema Psych: Age appropriate judgment and insight  Assessment:   No diagnosis found.   Plan:   Chronic, unsure if stable.  Continue metformin XR 1000 mg twice daily, Actos 45 mg daily, Amaryl 4 mg in the morning, milligrams in the evening, Farxiga 10 mg daily.  We will have him come back in around 5 weeks so he has been on the [place] for around 90 days and then recheck labs.  Will consider low-dose insulin or reaching out to our pharmacy team to see if a GLP-1 agonist could be  covered.  He will reach out to his insurance company to see what weekly injectable they will cover for diabetes management as well.  Would ideally wean him down from Amaryl in place of this. Chronic, unstable. Cont Xyzal and INCS. Start Singulair 10 mg/d.  The patient voiced understanding and agreement to the plan.  Jilda Roche [place], DO 01/09/22 8:53 AM

## 2022-01-09 NOTE — Patient Instructions (Signed)
If you do not hear anything about your referral in the next 1-2 weeks, call our office and ask for an update.  Give [place] 2-3 business days to get the results of your labs back.   Call your insurance company to see what weekly injectable they cover for diabetes.  Check your sugars in the evenings/afternoons from time to time.   Let us know if you need anything.

## 2022-01-19 ENCOUNTER — Other Ambulatory Visit: Payer: Self-pay | Admitting: Family Medicine

## 2022-01-19 DIAGNOSIS — E1169 Type 2 diabetes mellitus with other specified complication: Secondary | ICD-10-CM

## 2022-02-03 ENCOUNTER — Other Ambulatory Visit: Payer: Self-pay | Admitting: Family Medicine

## 2022-02-03 DIAGNOSIS — E1169 Type 2 diabetes mellitus with other specified complication: Secondary | ICD-10-CM

## 2022-02-11 ENCOUNTER — Other Ambulatory Visit: Payer: Self-pay | Admitting: Family Medicine

## 2022-02-11 DIAGNOSIS — J302 Other seasonal allergic rhinitis: Secondary | ICD-10-CM

## 2022-02-13 ENCOUNTER — Other Ambulatory Visit (INDEPENDENT_AMBULATORY_CARE_PROVIDER_SITE_OTHER): Payer: Commercial Managed Care - HMO

## 2022-02-13 DIAGNOSIS — E1169 Type 2 diabetes mellitus with other specified complication: Secondary | ICD-10-CM

## 2022-02-13 DIAGNOSIS — E669 Obesity, unspecified: Secondary | ICD-10-CM | POA: Diagnosis not present

## 2022-02-13 LAB — COMPREHENSIVE METABOLIC PANEL
ALT: 29 U/L (ref 0–53)
AST: 21 U/L (ref 0–37)
Albumin: 4.2 g/dL (ref 3.5–5.2)
Alkaline Phosphatase: 84 U/L (ref 39–117)
BUN: 25 mg/dL — ABNORMAL HIGH (ref 6–23)
CO2: 27 mEq/L (ref 19–32)
Calcium: 9.3 mg/dL (ref 8.4–10.5)
Chloride: 105 mEq/L (ref 96–112)
Creatinine, Ser: 1.06 mg/dL (ref 0.40–1.50)
GFR: 75.83 mL/min (ref >60.00)
Glucose, Bld: 179 mg/dL — ABNORMAL HIGH (ref 70–99)
Potassium: 4.8 mEq/L (ref 3.5–5.1)
Sodium: 141 mEq/L (ref 135–145)
Total Bilirubin: 0.4 mg/dL (ref 0.2–1.2)
Total Protein: 6.2 g/dL (ref 6.0–8.3)

## 2022-02-13 LAB — MICROALBUMIN / CREATININE URINE RATIO
Creatinine,U: 68.2 mg/dL
Microalb Creat Ratio: 5.1 mg/g (ref 0.0–30.0)
Microalb, Ur: 3.5 mg/dL — ABNORMAL HIGH (ref 0.0–1.9)

## 2022-02-13 LAB — HEMOGLOBIN A1C: Hgb A1c MFr Bld: 9 % — ABNORMAL HIGH (ref 4.6–6.5)

## 2022-02-14 ENCOUNTER — Other Ambulatory Visit: Payer: Self-pay | Admitting: Family Medicine

## 2022-02-14 DIAGNOSIS — E1169 Type 2 diabetes mellitus with other specified complication: Secondary | ICD-10-CM

## 2022-02-15 ENCOUNTER — Other Ambulatory Visit: Payer: Self-pay | Admitting: Family Medicine

## 2022-02-15 DIAGNOSIS — E669 Obesity, unspecified: Secondary | ICD-10-CM

## 2022-03-07 ENCOUNTER — Other Ambulatory Visit: Payer: Self-pay | Admitting: Family Medicine

## 2022-03-07 DIAGNOSIS — E1169 Type 2 diabetes mellitus with other specified complication: Secondary | ICD-10-CM

## 2022-03-12 ENCOUNTER — Other Ambulatory Visit: Payer: Self-pay | Admitting: Family Medicine

## 2022-03-12 DIAGNOSIS — E1169 Type 2 diabetes mellitus with other specified complication: Secondary | ICD-10-CM

## 2022-04-03 ENCOUNTER — Other Ambulatory Visit: Payer: Self-pay | Admitting: Family Medicine

## 2022-04-03 DIAGNOSIS — E1169 Type 2 diabetes mellitus with other specified complication: Secondary | ICD-10-CM

## 2022-04-06 ENCOUNTER — Other Ambulatory Visit: Payer: Self-pay | Admitting: Family Medicine

## 2022-04-06 DIAGNOSIS — E669 Obesity, unspecified: Secondary | ICD-10-CM

## 2022-04-28 ENCOUNTER — Other Ambulatory Visit: Payer: Self-pay | Admitting: Family Medicine

## 2022-04-28 DIAGNOSIS — E669 Obesity, unspecified: Secondary | ICD-10-CM

## 2022-05-04 ENCOUNTER — Other Ambulatory Visit: Payer: Self-pay | Admitting: Family Medicine

## 2022-05-04 DIAGNOSIS — E1169 Type 2 diabetes mellitus with other specified complication: Secondary | ICD-10-CM

## 2022-05-17 ENCOUNTER — Other Ambulatory Visit: Payer: Self-pay | Admitting: Family Medicine

## 2022-05-17 DIAGNOSIS — J302 Other seasonal allergic rhinitis: Secondary | ICD-10-CM

## 2022-05-25 ENCOUNTER — Other Ambulatory Visit: Payer: Self-pay | Admitting: Family Medicine

## 2022-05-25 DIAGNOSIS — E669 Obesity, unspecified: Secondary | ICD-10-CM

## 2022-06-02 ENCOUNTER — Other Ambulatory Visit: Payer: Self-pay | Admitting: Family Medicine

## 2022-06-02 DIAGNOSIS — E669 Obesity, unspecified: Secondary | ICD-10-CM

## 2022-06-24 ENCOUNTER — Other Ambulatory Visit: Payer: Self-pay | Admitting: Family Medicine

## 2022-06-24 DIAGNOSIS — E1169 Type 2 diabetes mellitus with other specified complication: Secondary | ICD-10-CM

## 2022-07-02 ENCOUNTER — Other Ambulatory Visit: Payer: Self-pay | Admitting: Family Medicine

## 2022-07-02 DIAGNOSIS — E1169 Type 2 diabetes mellitus with other specified complication: Secondary | ICD-10-CM

## 2022-07-05 ENCOUNTER — Other Ambulatory Visit: Payer: Self-pay | Admitting: Family Medicine

## 2022-07-05 DIAGNOSIS — E1169 Type 2 diabetes mellitus with other specified complication: Secondary | ICD-10-CM

## 2022-07-20 ENCOUNTER — Other Ambulatory Visit: Payer: Self-pay | Admitting: Family Medicine

## 2022-07-20 DIAGNOSIS — E1169 Type 2 diabetes mellitus with other specified complication: Secondary | ICD-10-CM

## 2022-07-28 ENCOUNTER — Other Ambulatory Visit: Payer: Self-pay | Admitting: Family Medicine

## 2022-07-28 DIAGNOSIS — E1169 Type 2 diabetes mellitus with other specified complication: Secondary | ICD-10-CM

## 2022-08-09 ENCOUNTER — Other Ambulatory Visit: Payer: Self-pay | Admitting: Family Medicine

## 2022-08-09 DIAGNOSIS — J302 Other seasonal allergic rhinitis: Secondary | ICD-10-CM

## 2022-08-15 ENCOUNTER — Other Ambulatory Visit: Payer: Self-pay | Admitting: Family Medicine

## 2022-08-15 DIAGNOSIS — E1169 Type 2 diabetes mellitus with other specified complication: Secondary | ICD-10-CM

## 2022-08-24 ENCOUNTER — Other Ambulatory Visit: Payer: Self-pay | Admitting: Family Medicine

## 2022-08-24 DIAGNOSIS — E1169 Type 2 diabetes mellitus with other specified complication: Secondary | ICD-10-CM

## 2022-08-29 ENCOUNTER — Encounter: Payer: Self-pay | Admitting: *Deleted

## 2022-08-29 ENCOUNTER — Other Ambulatory Visit: Payer: Self-pay | Admitting: Family Medicine

## 2022-09-18 ENCOUNTER — Other Ambulatory Visit: Payer: Self-pay | Admitting: Family Medicine

## 2022-09-18 DIAGNOSIS — E669 Obesity, unspecified: Secondary | ICD-10-CM

## 2022-10-02 ENCOUNTER — Other Ambulatory Visit: Payer: Self-pay | Admitting: Family Medicine

## 2022-10-02 DIAGNOSIS — E1169 Type 2 diabetes mellitus with other specified complication: Secondary | ICD-10-CM

## 2022-10-21 ENCOUNTER — Other Ambulatory Visit: Payer: Self-pay | Admitting: Family Medicine

## 2022-10-21 DIAGNOSIS — E669 Obesity, unspecified: Secondary | ICD-10-CM

## 2022-11-02 ENCOUNTER — Other Ambulatory Visit: Payer: Self-pay | Admitting: Family Medicine

## 2022-11-02 DIAGNOSIS — J302 Other seasonal allergic rhinitis: Secondary | ICD-10-CM

## 2022-11-22 ENCOUNTER — Other Ambulatory Visit: Payer: Self-pay | Admitting: Family Medicine

## 2022-11-22 DIAGNOSIS — E1169 Type 2 diabetes mellitus with other specified complication: Secondary | ICD-10-CM

## 2022-11-25 ENCOUNTER — Other Ambulatory Visit: Payer: Self-pay | Admitting: Family Medicine

## 2022-12-01 ENCOUNTER — Other Ambulatory Visit: Payer: Self-pay | Admitting: Family Medicine

## 2022-12-01 DIAGNOSIS — J302 Other seasonal allergic rhinitis: Secondary | ICD-10-CM

## 2022-12-14 ENCOUNTER — Other Ambulatory Visit: Payer: Self-pay | Admitting: Family Medicine

## 2022-12-14 DIAGNOSIS — E1169 Type 2 diabetes mellitus with other specified complication: Secondary | ICD-10-CM

## 2022-12-22 ENCOUNTER — Ambulatory Visit (INDEPENDENT_AMBULATORY_CARE_PROVIDER_SITE_OTHER): Payer: Managed Care, Other (non HMO) | Admitting: Family Medicine

## 2022-12-22 ENCOUNTER — Other Ambulatory Visit: Payer: Self-pay

## 2022-12-22 VITALS — BP 132/78 | HR 73 | Temp 98.0°F | Resp 16 | Ht 69.0 in | Wt 251.0 lb

## 2022-12-22 DIAGNOSIS — I10 Essential (primary) hypertension: Secondary | ICD-10-CM | POA: Diagnosis not present

## 2022-12-22 DIAGNOSIS — J302 Other seasonal allergic rhinitis: Secondary | ICD-10-CM

## 2022-12-22 DIAGNOSIS — Z7984 Long term (current) use of oral hypoglycemic drugs: Secondary | ICD-10-CM

## 2022-12-22 DIAGNOSIS — Z23 Encounter for immunization: Secondary | ICD-10-CM | POA: Diagnosis not present

## 2022-12-22 DIAGNOSIS — Z6837 Body mass index (BMI) 37.0-37.9, adult: Secondary | ICD-10-CM

## 2022-12-22 DIAGNOSIS — E1169 Type 2 diabetes mellitus with other specified complication: Secondary | ICD-10-CM

## 2022-12-22 DIAGNOSIS — Z1211 Encounter for screening for malignant neoplasm of colon: Secondary | ICD-10-CM

## 2022-12-22 DIAGNOSIS — B353 Tinea pedis: Secondary | ICD-10-CM

## 2022-12-22 DIAGNOSIS — E669 Obesity, unspecified: Secondary | ICD-10-CM

## 2022-12-22 LAB — COMPREHENSIVE METABOLIC PANEL
ALT: 30 U/L (ref 0–53)
AST: 21 U/L (ref 0–37)
Albumin: 4.6 g/dL (ref 3.5–5.2)
Alkaline Phosphatase: 102 U/L (ref 39–117)
BUN: 27 mg/dL — ABNORMAL HIGH (ref 6–23)
CO2: 30 meq/L (ref 19–32)
Calcium: 9.6 mg/dL (ref 8.4–10.5)
Chloride: 99 meq/L (ref 96–112)
Creatinine, Ser: 1.02 mg/dL (ref 0.40–1.50)
GFR: 78.94 mL/min (ref >60.00)
Glucose, Bld: 239 mg/dL — ABNORMAL HIGH (ref 70–99)
Potassium: 4.4 meq/L (ref 3.5–5.1)
Sodium: 138 meq/L (ref 135–145)
Total Bilirubin: 0.4 mg/dL (ref 0.2–1.2)
Total Protein: 6.8 g/dL (ref 6.0–8.3)

## 2022-12-22 LAB — LIPID PANEL
Cholesterol: 240 mg/dL — ABNORMAL HIGH (ref 0–200)
HDL: 46 mg/dL (ref >39.00)
LDL Cholesterol: 159 mg/dL — ABNORMAL HIGH (ref 0–99)
NonHDL: 193.75
Total CHOL/HDL Ratio: 5
Triglycerides: 172 mg/dL — ABNORMAL HIGH (ref 0.0–149.0)
VLDL: 34.4 mg/dL (ref 0.0–40.0)

## 2022-12-22 LAB — MICROALBUMIN / CREATININE URINE RATIO
Creatinine,U: 29.8 mg/dL
Microalb Creat Ratio: 2.5 mg/g (ref 0.0–30.0)
Microalb, Ur: 0.7 mg/dL (ref 0.0–1.9)

## 2022-12-22 LAB — HEMOGLOBIN A1C: Hgb A1c MFr Bld: 8.9 % — ABNORMAL HIGH (ref 4.6–6.5)

## 2022-12-22 MED ORDER — METFORMIN HCL ER 500 MG PO TB24: ORAL_TABLET | ORAL | 1 refills | Status: DC

## 2022-12-22 MED ORDER — OLMESARTAN MEDOXOMIL 20 MG PO TABS: 20.0000 mg | ORAL_TABLET | Freq: Every day | ORAL | 1 refills | Status: DC

## 2022-12-22 MED ORDER — PIOGLITAZONE HCL 45 MG PO TABS: 45.0000 mg | ORAL_TABLET | Freq: Every day | ORAL | 1 refills | Status: DC

## 2022-12-22 MED ORDER — FARXIGA 10 MG PO TABS: 10.0000 mg | ORAL_TABLET | Freq: Every day | ORAL | 1 refills | Status: DC

## 2022-12-22 MED ORDER — GLIMEPIRIDE 4 MG PO TABS: 4.0000 mg | ORAL_TABLET | Freq: Three times a day (TID) | ORAL | 1 refills | Status: DC

## 2022-12-22 MED ORDER — KETOCONAZOLE 2 % EX CREA: 1.0000 | TOPICAL_CREAM | Freq: Every day | CUTANEOUS | 0 refills | Status: DC

## 2022-12-22 MED ORDER — LEVOCETIRIZINE DIHYDROCHLORIDE 5 MG PO TABS: 5.0000 mg | ORAL_TABLET | Freq: Every evening | ORAL | 1 refills | Status: DC

## 2022-12-22 NOTE — Progress Notes (Signed)
Subjective:   Chief Complaint  Patient presents with   Follow-up    Follow up    Jon Archer is a 62 y.o. male here for follow-up of diabetes.   Jon Archer's self monitored glucose range is low 100's.  Patient denies hypoglycemic reactions. He checks his glucose levels 1 time(s) per day. Patient does not require insulin.   Medications include: Actos 45 mg/d, Metformin XR 1000 mg bid, Farxiga 10 mg/d, Amaryl 4 mg TID.  Diet is OK.  Exercise: active w work  Hypertension Patient presents for hypertension follow up. He does not monitor home blood pressures. He is compliant with medications. Patient has these side effects of medication: none Diet/exercise as above.   Past Medical History:  Diagnosis Date   Carpal tunnel syndrome, bilateral    Diabetes mellitus without complication (HCC)    Hyperlipidemia    Hypertension      Related testing: Retinal exam: Done Pneumovax: done  Objective:  BP 132/78 (BP Location: Left Arm, Patient Position: Sitting, Cuff Size: Normal)   Pulse 73   Temp 98 F (36.7 C) (Oral)   Resp 16   Ht 5\' 9"  (1.753 m)   Wt 251 lb (113.9 kg)   SpO2 98%   BMI 37.07 kg/m  General:  Well developed, well nourished, in no apparent distress Skin: macerated tissue between 4/5th digit on R foot.  Warm, no pallor or diaphoresis Head:  Normocephalic, atraumatic Eyes:  Pupils equal and round, sclera anicteric without injection  Lungs:  CTAB, no access msc use Cardio:  RRR, no bruits, no LE edema Musculoskeletal:  Symmetrical muscle groups noted without atrophy or deformity Neuro:  Sensation intact to pinprick on feet w exception of R lateral plantar surface, decreased Psych: Age appropriate judgment and insight  Assessment:   Type 2 diabetes mellitus with obesity (HCC) - Plan: metFORMIN (GLUCOPHAGE-XR) 500 MG 24 hr tablet, pioglitazone (ACTOS) 45 MG tablet, glimepiride (AMARYL) 4 MG tablet, FARXIGA 10 MG TABS tablet, Comprehensive metabolic panel, Lipid  panel, Microalbumin / creatinine urine ratio, Hemoglobin A1c  Essential hypertension, benign - Plan: olmesartan (BENICAR) 20 MG tablet  Seasonal allergic rhinitis, unspecified trigger - Plan: levocetirizine (XYZAL) 5 MG tablet  Screen for colon cancer - Plan: Cologuard  Need for Tdap vaccination - Plan: Tdap vaccine greater than or equal to 7yo IM  Tinea pedis of right foot - Plan: ketoconazole (NIZORAL) 2 % cream   Plan:   Chronic, unsure if stable.  Continue Farxiga 10 mg daily, metformin XR 1000 mg twice daily, Actos 45 mg daily, Amaryl 4 mg 3 times daily.  If still not controlled, would put a consult into the pharmacy team to see if they can help with cost for a GLP-1.  Counseled on diet and exercise. Chronic, stable.  Questionable cough, probably related to upper airway cough syndrome from allergies but will change lisinopril to Benicar 20 mg daily.  Keep an eye on blood pressure at home. Refill Xyzal. He is due for colon cancer screening and is not thrilled with the idea of a colonoscopy.  We did discuss Cologuard which he is in agreements to have done. Tdap today. Between the fourth and fifth digit, evidence of fungal infection.  6 weeks of ketoconazole cream daily. F/u in 3 mo. The patient voiced understanding and agreement to the plan.  Jilda Roche [place], DO 12/22/22 10:41 AM

## 2022-12-22 NOTE — Patient Instructions (Addendum)
Give [place] 2-3 business days to get the results of your labs back.   You should receive a kit in the mail from the [facility]olog[facility] in the next 1-2 weeks. If you do not, let us know.   Keep the diet clean and stay active.  Foods that may reduce pain: 1) Ginger 2) Blueberries 3) Salmon 4) Pumpkin seeds 5) dark chocolate 6) turmeric 7) tart cherries 8) virgin olive oil 9) chilli peppers 10) mint 11) krill oil  Let us know if you need anything.

## 2023-01-10 LAB — COLOGUARD: COLOGUARD: NEGATIVE

## 2023-01-15 ENCOUNTER — Other Ambulatory Visit: Payer: Self-pay | Admitting: Family Medicine

## 2023-01-15 DIAGNOSIS — B353 Tinea pedis: Secondary | ICD-10-CM

## 2023-03-07 ENCOUNTER — Telehealth (INDEPENDENT_AMBULATORY_CARE_PROVIDER_SITE_OTHER): Payer: Commercial Managed Care - HMO | Admitting: Family Medicine

## 2023-03-07 DIAGNOSIS — J00 Acute nasopharyngitis [common cold]: Secondary | ICD-10-CM

## 2023-03-07 MED ORDER — IPRATROPIUM BROMIDE 0.03 % NA SOLN: 2.0000 | Freq: Two times a day (BID) | NASAL | 2 refills | Status: AC

## 2023-03-07 NOTE — Progress Notes (Signed)
 Chief Complaint  Patient presents with   Nasal Congestion    Nasal Congestion    Jon Archer here for URI complaints. We are interacting via web portal for an electronic face-to-face visit. I verified patient's ID using 2 identifiers. Patient agreed to proceed with visit via this method. Patient is at home, I am at office. Patient and I are present for visit.   Duration: 3 days; worsening Associated symptoms: sinus congestion, sinus pain, rhinorrhea, sore throat, myalgia, and slight cough from drainage Denies: itchy watery eyes, ear pain, ear drainage, wheezing, shortness of breath, and fevers, dental pain Treatment to date: Nyquil, herbal tea, ibuprofen, sinus rinses Sick contacts: No  Past Medical History:  Diagnosis Date   Carpal tunnel syndrome, bilateral    Diabetes mellitus without complication (HCC)    Hyperlipidemia    Hypertension     Objective No conversational dyspnea Age appropriate judgment and insight Nml affect and mood  Acute irritant rhinitis - Plan: ipratropium (ATROVENT ) 0.03 % nasal spray  Cont Xyzal . Go back on INCS. Add Above. Send message if no better. Continue to push fluids, practice good hand hygiene, cover mouth when coughing. F/u prn. If starting to experience fevers, shaking, or shortness of breath, seek immediate care. Pt voiced understanding and agreement to the plan.  Mabel [place], DO 03/07/23 8:47 AM

## 2023-03-21 ENCOUNTER — Ambulatory Visit: Payer: Managed Care, Other (non HMO) | Admitting: Family Medicine

## 2023-04-11 ENCOUNTER — Ambulatory Visit: Payer: Managed Care, Other (non HMO) | Admitting: Family Medicine

## 2023-04-23 ENCOUNTER — Ambulatory Visit: Payer: Managed Care, Other (non HMO) | Admitting: Family Medicine

## 2023-04-25 ENCOUNTER — Encounter: Payer: Self-pay | Admitting: Family Medicine

## 2023-04-25 ENCOUNTER — Ambulatory Visit (INDEPENDENT_AMBULATORY_CARE_PROVIDER_SITE_OTHER): Payer: Commercial Managed Care - HMO | Admitting: Family Medicine

## 2023-04-25 VITALS — BP 128/74 | HR 74 | Temp 98.0°F | Resp 16 | Ht 69.0 in | Wt 254.6 lb

## 2023-04-25 DIAGNOSIS — E669 Obesity, unspecified: Secondary | ICD-10-CM | POA: Diagnosis not present

## 2023-04-25 DIAGNOSIS — Z7985 Long-term (current) use of injectable non-insulin antidiabetic drugs: Secondary | ICD-10-CM | POA: Diagnosis not present

## 2023-04-25 DIAGNOSIS — E1169 Type 2 diabetes mellitus with other specified complication: Secondary | ICD-10-CM

## 2023-04-25 DIAGNOSIS — Z7984 Long term (current) use of oral hypoglycemic drugs: Secondary | ICD-10-CM

## 2023-04-25 LAB — HEMOGLOBIN A1C: Hgb A1c MFr Bld: 9.7 % — ABNORMAL HIGH (ref 4.6–6.5)

## 2023-04-25 MED ORDER — TIRZEPATIDE 2.5 MG/0.5ML ~~LOC~~ SOAJ: 2.5000 mg | SUBCUTANEOUS | 0 refills | Status: DC

## 2023-04-25 MED ORDER — TIRZEPATIDE 5 MG/0.5ML ~~LOC~~ SOAJ: 5.0000 mg | SUBCUTANEOUS | 0 refills | Status: DC

## 2023-04-25 MED ORDER — TIRZEPATIDE 7.5 MG/0.5ML ~~LOC~~ SOAJ: 7.5000 mg | SUBCUTANEOUS | 0 refills | Status: DC

## 2023-04-25 NOTE — Patient Instructions (Signed)
 Give [place] 2-3 business days to get the results of your labs back.   If you do not hear anything about your referral in the next 1-2 weeks, call our office and ask for an update.  Let me know if there are cost issues.  Please continue checking your sugars at home.   Let us know if you need anything.

## 2023-04-25 NOTE — Progress Notes (Signed)
 Subjective:   Chief Complaint  Patient presents with   Follow-up    Follow up    Jon Archer is a 63 y.o. male here for follow-up of diabetes.   Jon Archer self monitored glucose range is mid 100's-200's.  Patient denies hypoglycemic reactions. He checks his glucose levels 1 time(s) per day. Patient does not require insulin.   Medications include: Actos 45 mg/d, Farxiga 10 mg/d, Amaryl 4 mg TID, Metformin XR 1000 mg bid Diet is mostly good.  Exercise: active  Past Medical History:  Diagnosis Date   Carpal tunnel syndrome, bilateral    Diabetes mellitus without complication (HCC)    Hyperlipidemia    Hypertension      Related testing: Retinal exam: Due Pneumovax: done  Objective:  BP 128/74 (BP Location: Left Arm, Patient Position: Sitting)   Pulse 74   Temp 98 F (36.7 C) (Oral)   Resp 16   Ht 5\' 9"  (1.753 m)   Wt 254 lb 9.6 oz (115.5 kg)   SpO2 98%   BMI 37.60 kg/m  General:  Well developed, well nourished, in no apparent distress Lungs:  CTAB, no access msc use Cardio:  RRR, no bruits, 2+ b/l LE edema tapering at the prox 1/3 of the tibia Psych: Age appropriate judgment and insight  Assessment:   Type 2 diabetes mellitus with obesity (HCC) - Plan: tirzepatide (MOUNJARO) 2.5 MG/0.5ML Pen, tirzepatide (MOUNJARO) 5 MG/0.5ML Pen, tirzepatide (MOUNJARO) 7.5 MG/0.5ML Pen, Hemoglobin A1c   Plan:   Chronic, uncontrolled. Cont Metformin XR 1000 mg bid, Actos 45 mg daily, Amaryl 4 mg TID, Farxiga 10 mg/d.  Start Mounjaro 2.5 mg weekly and titrate up.  He will let me know if there are cost issues.  Parents insurance, looks like Trulicity is preferred.  It is difficult for him to make appointments due to the health of his wife and his work requirements.  He will consider an ophthalmology referral but would prefer to hold off on the endocrinology referral for now.  Counseled on diet and exercise.  Continue to monitor sugars at home. F/u in 3 mo. The patient voiced  understanding and agreement to the plan.  Jilda Roche [place], DO 04/25/23 7:38 AM

## 2023-04-26 ENCOUNTER — Other Ambulatory Visit: Payer: Self-pay | Admitting: Family Medicine

## 2023-04-26 MED ORDER — TRULICITY 1.5 MG/0.5ML ~~LOC~~ SOAJ
1.5000 mg | SUBCUTANEOUS | 0 refills | Status: DC
Start: 2023-05-24 — End: 2023-07-24

## 2023-04-26 MED ORDER — TRULICITY 0.75 MG/0.5ML ~~LOC~~ SOAJ: 0.7500 mg | SUBCUTANEOUS | 0 refills | Status: AC

## 2023-04-26 MED ORDER — TRULICITY 3 MG/0.5ML ~~LOC~~ SOAJ: 3.0000 mg | SUBCUTANEOUS | 0 refills | Status: DC

## 2023-04-27 ENCOUNTER — Other Ambulatory Visit (HOSPITAL_COMMUNITY): Payer: Self-pay

## 2023-05-09 ENCOUNTER — Telehealth: Payer: Self-pay

## 2023-05-09 ENCOUNTER — Other Ambulatory Visit (HOSPITAL_COMMUNITY): Payer: Self-pay

## 2023-05-09 NOTE — Telephone Encounter (Signed)
 Pharmacy Patient Advocate Encounter   Received notification from CoverMyMeds that prior authorization for Trulicity 0.75MG /0.5ML auto-injectors is required/requested.   Insurance verification completed.   The patient is insured through [employer] .   Per test claim: PA required; PA submitted to above mentioned insurance via CoverMyMeds Key/confirmation #/EOC [id number] Status is pending

## 2023-05-10 ENCOUNTER — Other Ambulatory Visit (HOSPITAL_COMMUNITY): Payer: Self-pay

## 2023-05-11 ENCOUNTER — Other Ambulatory Visit (HOSPITAL_COMMUNITY): Payer: Self-pay

## 2023-05-11 NOTE — Telephone Encounter (Signed)
 Pharmacy Patient Advocate Encounter  Received notification from [employer] that Prior Authorization for Trulicity 0.75MG /0.5ML auto-injectors has been APPROVED from 05/09/23 to 05/09/24. Unable to obtain price due to refill too soon rejection, last fill date 05/10/23 next available fill date03/26/25   PA #/Case ID/Reference #: [id number]

## 2023-06-01 ENCOUNTER — Other Ambulatory Visit: Payer: Self-pay | Admitting: Family Medicine

## 2023-06-01 NOTE — Telephone Encounter (Signed)
 Copied from CRM [id number]. Topic: Clinical - Prescription Issue >> Jun 01, 2023  2:12 PM Saverio Danker wrote: Reason for CRM: Patient is calling because his Dulaglutide 0.75 MG/0.5ML Trulicity was refused. He would like someone to call him and go over why it was denied

## 2023-06-05 ENCOUNTER — Other Ambulatory Visit: Payer: Self-pay | Admitting: Family Medicine

## 2023-06-05 NOTE — Telephone Encounter (Signed)
 I already sent the 3 mg Trulicity to the pharmacy, he shouldn't be out yet?

## 2023-06-08 NOTE — Telephone Encounter (Signed)
 Patient was advised and verbalized understanding. He going to call [facility] to check on the medication.

## 2023-06-11 ENCOUNTER — Other Ambulatory Visit: Payer: Self-pay | Admitting: Family Medicine

## 2023-06-11 DIAGNOSIS — E1169 Type 2 diabetes mellitus with other specified complication: Secondary | ICD-10-CM

## 2023-06-20 ENCOUNTER — Other Ambulatory Visit: Payer: Self-pay | Admitting: Family Medicine

## 2023-06-20 DIAGNOSIS — E1169 Type 2 diabetes mellitus with other specified complication: Secondary | ICD-10-CM

## 2023-07-02 ENCOUNTER — Encounter: Payer: Self-pay | Admitting: Family Medicine

## 2023-07-12 ENCOUNTER — Other Ambulatory Visit: Payer: Self-pay | Admitting: Family Medicine

## 2023-07-12 DIAGNOSIS — E669 Obesity, unspecified: Secondary | ICD-10-CM

## 2023-07-24 ENCOUNTER — Other Ambulatory Visit: Payer: Self-pay | Admitting: Family Medicine

## 2023-07-25 ENCOUNTER — Other Ambulatory Visit: Payer: Self-pay | Admitting: Family Medicine

## 2023-07-25 DIAGNOSIS — I10 Essential (primary) hypertension: Secondary | ICD-10-CM

## 2023-07-27 ENCOUNTER — Encounter: Payer: Self-pay | Admitting: Family Medicine

## 2023-07-27 ENCOUNTER — Ambulatory Visit: Payer: Self-pay | Admitting: Family Medicine

## 2023-07-27 ENCOUNTER — Ambulatory Visit (INDEPENDENT_AMBULATORY_CARE_PROVIDER_SITE_OTHER): Payer: Commercial Managed Care - HMO | Admitting: Family Medicine

## 2023-07-27 DIAGNOSIS — Z7984 Long term (current) use of oral hypoglycemic drugs: Secondary | ICD-10-CM

## 2023-07-27 DIAGNOSIS — Z7985 Long-term (current) use of injectable non-insulin antidiabetic drugs: Secondary | ICD-10-CM

## 2023-07-27 DIAGNOSIS — E669 Obesity, unspecified: Secondary | ICD-10-CM | POA: Diagnosis not present

## 2023-07-27 DIAGNOSIS — E1169 Type 2 diabetes mellitus with other specified complication: Secondary | ICD-10-CM

## 2023-07-27 LAB — HEMOGLOBIN A1C: Hgb A1c MFr Bld: 7.6 % — ABNORMAL HIGH (ref 4.6–6.5)

## 2023-07-27 MED ORDER — GLIMEPIRIDE 4 MG PO TABS
4.0000 mg | ORAL_TABLET | Freq: Two times a day (BID) | ORAL | Status: DC
Start: 2023-07-27 — End: 2023-09-17

## 2023-07-27 MED ORDER — TRULICITY 4.5 MG/0.5ML ~~LOC~~ SOAJ: 4.5000 mg | SUBCUTANEOUS | 5 refills | Status: DC

## 2023-07-27 NOTE — Progress Notes (Signed)
 Subjective:   Chief Complaint  Patient presents with   Follow-up    Follow Up     Jon Archer is a 63 y.o. male here for follow-up of diabetes.   Jon Archer's self monitored glucose range is 80-low/mid 100's.  Patient denies hypoglycemic reactions. He checks his glucose levels 1 time(s) per day. Patient does not require insulin.   Medications include: Trulicity 3 mg/week, Actos  45 mg daily, metformin  XR 1000 mg twice daily, glimepiride  4 mg 3 times daily, Farxiga  10 mg daily. Diet is okay.  Exercise: Active at work No chest pain or shortness of breath  Past Medical History:  Diagnosis Date   Carpal tunnel syndrome, bilateral    Diabetes mellitus without complication (HCC)    Hyperlipidemia    Hypertension      Related testing: Retinal exam: Due Pneumovax: not done  Objective:  BP 126/72 (BP Location: Left Arm, Patient Position: Sitting)   Pulse 82   Temp 98 F (36.7 C) (Oral)   Resp 16   Ht 5\' 9"  (1.753 m)   Wt 258 lb (117 kg)   SpO2 97%   BMI 38.10 kg/m  General:  Well developed, well nourished, in no apparent distress Lungs:  CTAB, no access msc use Cardio:  RRR, no bruits, no LE edema Psych: Age appropriate judgment and insight  Assessment:   Type 2 diabetes mellitus with obesity (HCC) - Plan: Hemoglobin A1c, Ambulatory referral to Ophthalmology   Plan:   Chronic, uncontrolled. Counseled on diet and exercise. Refer to ophtho. Cont Actos  45 mg/d, metformin  XR 1000 mg bid, Amaryl  4 mg in AM, 8 mg in PM, Farxiga  10 mg/d. Will probably increase Trulicity to 4.5 mg/week and hopefully cut down on his SU.  F/u in 3-6 mo. The patient voiced understanding and agreement to the plan.  Jon Archer [place], DO 07/27/23 8:04 AM

## 2023-07-27 NOTE — Patient Instructions (Addendum)
 Give us  2-3 business days to get the results of your labs back.   Keep checking your sugars at home.  If you do not hear anything about your referral in the next 1-2 weeks, call our office and ask for an update.  Heat (pad or rice pillow in microwave) over affected area, 10-15 minutes twice daily.   Let us  know if you need anything.  EXERCISES  RANGE OF MOTION (ROM) AND STRETCHING EXERCISES - Low Back Pain Most people with lower back pain will find that their symptoms get worse with excessive bending forward (flexion) or arching at the lower back (extension). The exercises that will help resolve your symptoms will focus on the opposite motion.  If you have pain, numbness or tingling which travels down into your buttocks, leg or foot, the goal of the therapy is for these symptoms to move closer to your back and eventually resolve. Sometimes, these leg symptoms will get better, but your lower back pain may worsen. This is often an indication of progress in your rehabilitation. Be very alert to any changes in your symptoms and the activities in which you participated in the 24 hours prior to the change. Sharing this information with your caregiver will allow him or her to most efficiently treat your condition. These exercises may help you when beginning to rehabilitate your injury. Your symptoms may resolve with or without further involvement from your physician, physical therapist or athletic trainer. While completing these exercises, remember:  Restoring tissue flexibility helps normal motion to return to the joints. This allows healthier, less painful movement and activity. An effective stretch should be held for at least 30 seconds. A stretch should never be painful. You should only feel a gentle lengthening or release in the stretched tissue. FLEXION RANGE OF MOTION AND STRETCHING EXERCISES:  STRETCH - Flexion, Single Knee to Chest  Lie on a firm bed or floor with both legs extended in front  of you. Keeping one leg in contact with the floor, bring your opposite knee to your chest. Hold your leg in place by either grabbing behind your thigh or at your knee. Pull until you feel a gentle stretch in your low back. Hold 30 seconds. Slowly release your grasp and repeat the exercise with the opposite side. Repeat 2 times. Complete this exercise 3 times per week.   STRETCH - Flexion, Double Knee to Chest Lie on a firm bed or floor with both legs extended in front of you. Keeping one leg in contact with the floor, bring your opposite knee to your chest. Tense your stomach muscles to support your back and then lift your other knee to your chest. Hold your legs in place by either grabbing behind your thighs or at your knees. Pull both knees toward your chest until you feel a gentle stretch in your low back. Hold 30 seconds. Tense your stomach muscles and slowly return one leg at a time to the floor. Repeat 2 times. Complete this exercise 3 times per week.   STRETCH - Low Trunk Rotation Lie on a firm bed or floor. Keeping your legs in front of you, bend your knees so they are both pointed toward the ceiling and your feet are flat on the floor. Extend your arms out to the side. This will stabilize your upper body by keeping your shoulders in contact with the floor. Gently and slowly drop both knees together to one side until you feel a gentle stretch in your low back. Hold  for 30 seconds. Tense your stomach muscles to support your lower back as you bring your knees back to the starting position. Repeat the exercise to the other side. Repeat 2 times. Complete this exercise at least 3 times per week.   EXTENSION RANGE OF MOTION AND FLEXIBILITY EXERCISES:  STRETCH - Extension, Prone on Elbows  Lie on your stomach on the floor, a bed will be too soft. Place your palms about shoulder width apart and at the height of your head. Place your elbows under your shoulders. If this is too painful, stack  pillows under your chest. Allow your body to relax so that your hips drop lower and make contact more completely with the floor. Hold this position for 30 seconds. Slowly return to lying flat on the floor. Repeat 2 times. Complete this exercise 3 times per week.   RANGE OF MOTION - Extension, Prone Press Ups Lie on your stomach on the floor, a bed will be too soft. Place your palms about shoulder width apart and at the height of your head. Keeping your back as relaxed as possible, slowly straighten your elbows while keeping your hips on the floor. You may adjust the placement of your hands to maximize your comfort. As you gain motion, your hands will come more underneath your shoulders. Hold this position 30 seconds. Slowly return to lying flat on the floor. Repeat 2 times. Complete this exercise 3 times per week.   RANGE OF MOTION- Quadruped, Neutral Spine  Assume a hands and knees position on a firm surface. Keep your hands under your shoulders and your knees under your hips. You may place padding under your knees for comfort. Drop your head and point your tailbone toward the ground below you. This will round out your lower back like an angry cat. Hold this position for 30 seconds. Slowly lift your head and release your tail bone so that your back sags into a large arch, like an old horse. Hold this position for 30 seconds. Repeat this until you feel limber in your low back. Now, find your "sweet spot." This will be the most comfortable position somewhere between the two previous positions. This is your neutral spine. Once you have found this position, tense your stomach muscles to support your low back. Hold this position for 30 seconds. Repeat 2 times. Complete this exercise 3 times per week.   STRENGTHENING EXERCISES - Low Back Sprain These exercises may help you when beginning to rehabilitate your injury. These exercises should be done near your "sweet spot." This is the neutral,  low-back arch, somewhere between fully rounded and fully arched, that is your least painful position. When performed in this safe range of motion, these exercises can be used for people who have either a flexion or extension based injury. These exercises may resolve your symptoms with or without further involvement from your physician, physical therapist or athletic trainer. While completing these exercises, remember:  Muscles can gain both the endurance and the strength needed for everyday activities through controlled exercises. Complete these exercises as instructed by your physician, physical therapist or athletic trainer. Increase the resistance and repetitions only as guided. You may experience muscle soreness or fatigue, but the pain or discomfort you are trying to eliminate should never worsen during these exercises. If this pain does worsen, stop and make certain you are following the directions exactly. If the pain is still present after adjustments, discontinue the exercise until you can discuss the trouble with your caregiver.  STRENGTHENING - Deep Abdominals, Pelvic Tilt  Lie on a firm bed or floor. Keeping your legs in front of you, bend your knees so they are both pointed toward the ceiling and your feet are flat on the floor. Tense your lower abdominal muscles to press your low back into the floor. This motion will rotate your pelvis so that your tail bone is scooping upwards rather than pointing at your feet or into the floor. With a gentle tension and even breathing, hold this position for 3 seconds. Repeat 2 times. Complete this exercise 3 times per week.   STRENGTHENING - Abdominals, Crunches  Lie on a firm bed or floor. Keeping your legs in front of you, bend your knees so they are both pointed toward the ceiling and your feet are flat on the floor. Cross your arms over your chest. Slightly tip your chin down without bending your neck. Tense your abdominals and slowly lift your  trunk high enough to just clear your shoulder blades. Lifting higher can put excessive stress on the lower back and does not further strengthen your abdominal muscles. Control your return to the starting position. Repeat 2 times. Complete this exercise 3 times per week.   STRENGTHENING - Quadruped, Opposite UE/LE Lift  Assume a hands and knees position on a firm surface. Keep your hands under your shoulders and your knees under your hips. You may place padding under your knees for comfort. Find your neutral spine and gently tense your abdominal muscles so that you can maintain this position. Your shoulders and hips should form a rectangle that is parallel with the floor and is not twisted. Keeping your trunk steady, lift your right hand no higher than your shoulder and then your left leg no higher than your hip. Make sure you are not holding your breath. Hold this position for 30 seconds. Continuing to keep your abdominal muscles tense and your back steady, slowly return to your starting position. Repeat with the opposite arm and leg. Repeat 2 times. Complete this exercise 3 times per week.   STRENGTHENING - Abdominals and Quadriceps, Straight Leg Raise  Lie on a firm bed or floor with both legs extended in front of you. Keeping one leg in contact with the floor, bend the other knee so that your foot can rest flat on the floor. Find your neutral spine, and tense your abdominal muscles to maintain your spinal position throughout the exercise. Slowly lift your straight leg off the floor about 6 inches for a count of 3, making sure to not hold your breath. Still keeping your neutral spine, slowly lower your leg all the way to the floor. Repeat this exercise with each leg 2 times. Complete this exercise 3 times per week.  POSTURE AND BODY MECHANICS CONSIDERATIONS - Low Back Sprain Keeping correct posture when sitting, standing or completing your activities will reduce the stress put on different body  tissues, allowing injured tissues a chance to heal and limiting painful experiences. The following are general guidelines for improved posture.  While reading these guidelines, remember: The exercises prescribed by your provider will help you have the flexibility and strength to maintain correct postures. The correct posture provides the best environment for your joints to work. All of your joints have less wear and tear when properly supported by a spine with good posture. This means you will experience a healthier, less painful body. Correct posture must be practiced with all of your activities, especially prolonged sitting and standing. Correct  posture is as important when doing repetitive low-stress activities (typing) as it is when doing a single heavy-load activity (lifting).  RESTING POSITIONS Consider which positions are most painful for you when choosing a resting position. If you have pain with flexion-based activities (sitting, bending, stooping, squatting), choose a position that allows you to rest in a less flexed posture. You would want to avoid curling into a fetal position on your side. If your pain worsens with extension-based activities (prolonged standing, working overhead), avoid resting in an extended position such as sleeping on your stomach. Most people will find more comfort when they rest with their spine in a more neutral position, neither too rounded nor too arched. Lying on a non-sagging bed on your side with a pillow between your knees, or on your back with a pillow under your knees will often provide some relief. Keep in mind, being in any one position for a prolonged period of time, no matter how correct your posture, can still lead to stiffness.  PROPER SITTING POSTURE In order to minimize stress and discomfort on your spine, you must sit with correct posture. Sitting with good posture should be effortless for a healthy body. Returning to good posture is a gradual process.  Many people can work toward this most comfortably by using various supports until they have the flexibility and strength to maintain this posture on their own. When sitting with proper posture, your ears will fall over your shoulders and your shoulders will fall over your hips. You should use the back of the chair to support your upper back. Your lower back will be in a neutral position, just slightly arched. You may place a small pillow or folded towel at the base of your lower back for  support.  When working at a desk, create an environment that supports good, upright posture. Without extra support, muscles tire, which leads to excessive strain on joints and other tissues. Keep these recommendations in mind:  CHAIR: A chair should be able to slide under your desk when your back makes contact with the back of the chair. This allows you to work closely. The chair's height should allow your eyes to be level with the upper part of your monitor and your hands to be slightly lower than your elbows.  BODY POSITION Your feet should make contact with the floor. If this is not possible, use a foot rest. Keep your ears over your shoulders. This will reduce stress on your neck and low back.  INCORRECT SITTING POSTURES  If you are feeling tired and unable to assume a healthy sitting posture, do not slouch or slump. This puts excessive strain on your back tissues, causing more damage and pain. Healthier options include: Using more support, like a lumbar pillow. Switching tasks to something that requires you to be upright or walking. Talking a brief walk. Lying down to rest in a neutral-spine position.  PROLONGED STANDING WHILE SLIGHTLY LEANING FORWARD  When completing a task that requires you to lean forward while standing in one place for a long time, place either foot up on a stationary 2-4 inch high object to help maintain the best posture. When both feet are on the ground, the lower back tends to lose  its slight inward curve. If this curve flattens (or becomes too large), then the back and your other joints will experience too much stress, tire more quickly, and can cause pain.  CORRECT STANDING POSTURES Proper standing posture should be assumed with all  daily activities, even if they only take a few moments, like when brushing your teeth. As in sitting, your ears should fall over your shoulders and your shoulders should fall over your hips. You should keep a slight tension in your abdominal muscles to brace your spine. Your tailbone should point down to the ground, not behind your body, resulting in an over-extended swayback posture.   INCORRECT STANDING POSTURES  Common incorrect standing postures include a forward head, locked knees and/or an excessive swayback. WALKING Walk with an upright posture. Your ears, shoulders and hips should all line-up.  PROLONGED ACTIVITY IN A FLEXED POSITION When completing a task that requires you to bend forward at your waist or lean over a low surface, try to find a way to stabilize 3 out of 4 of your limbs. You can place a hand or elbow on your thigh or rest a knee on the surface you are reaching across. This will provide you more stability, so that your muscles do not tire as quickly. By keeping your knees relaxed, or slightly bent, you will also reduce stress across your lower back. CORRECT LIFTING TECHNIQUES  DO : Assume a wide stance. This will provide you more stability and the opportunity to get as close as possible to the object which you are lifting. Tense your abdominals to brace your spine. Bend at the knees and hips. Keeping your back locked in a neutral-spine position, lift using your leg muscles. Lift with your legs, keeping your back straight. Test the weight of unknown objects before attempting to lift them. Try to keep your elbows locked down at your sides in order get the best strength from your shoulders when carrying an object.   Always  ask for help when lifting heavy or awkward objects. INCORRECT LIFTING TECHNIQUES DO NOT:  Lock your knees when lifting, even if it is a small object. Bend and twist. Pivot at your feet or move your feet when needing to change directions. Assume that you can safely pick up even a paperclip without proper posture.

## 2023-08-01 ENCOUNTER — Other Ambulatory Visit: Payer: Self-pay | Admitting: Family Medicine

## 2023-08-01 DIAGNOSIS — I10 Essential (primary) hypertension: Secondary | ICD-10-CM

## 2023-08-01 DIAGNOSIS — J302 Other seasonal allergic rhinitis: Secondary | ICD-10-CM

## 2023-08-03 ENCOUNTER — Other Ambulatory Visit: Payer: Self-pay

## 2023-08-03 MED ORDER — BLOOD GLUCOSE MONITORING SUPPL DEVI: 1.0000 | Freq: Three times a day (TID) | 0 refills | Status: AC

## 2023-08-03 MED ORDER — BLOOD GLUCOSE TEST VI STRP: 1.0000 | ORAL_STRIP | Freq: Three times a day (TID) | 0 refills | Status: DC

## 2023-08-03 MED ORDER — LANCETS MISC. MISC
1.0000 | Freq: Three times a day (TID) | 0 refills | Status: AC
Start: 2023-08-03 — End: 2023-09-02

## 2023-08-03 MED ORDER — LANCET DEVICE MISC
1.0000 | Freq: Three times a day (TID) | 0 refills | Status: AC
Start: 2023-08-03 — End: 2023-09-02

## 2023-09-03 ENCOUNTER — Other Ambulatory Visit: Payer: Self-pay | Admitting: Family Medicine

## 2023-09-03 DIAGNOSIS — E1169 Type 2 diabetes mellitus with other specified complication: Secondary | ICD-10-CM

## 2023-09-15 ENCOUNTER — Other Ambulatory Visit: Payer: Self-pay | Admitting: Family Medicine

## 2023-09-15 DIAGNOSIS — E1169 Type 2 diabetes mellitus with other specified complication: Secondary | ICD-10-CM

## 2023-09-19 ENCOUNTER — Telehealth: Payer: Self-pay | Admitting: Pharmacy Technician

## 2023-09-19 ENCOUNTER — Other Ambulatory Visit (HOSPITAL_COMMUNITY): Payer: Self-pay

## 2023-09-19 NOTE — Telephone Encounter (Signed)
 Pharmacy Patient Advocate Encounter   Received notification from [employer] that prior authorization for TRULICITY  4.5MG /0.5ML AUTO-INEJECTORS is required/requested.   Insurance verification completed.   The patient is insured through [employer] .   Per test claim: PA required; PA submitted to above mentioned insurance via [employer] Key/confirmation #/EOC [id number] Status is pending

## 2023-09-20 ENCOUNTER — Other Ambulatory Visit (HOSPITAL_COMMUNITY): Payer: Self-pay

## 2023-09-20 NOTE — Telephone Encounter (Signed)
 Pharmacy Patient Advocate Encounter  Received notification from [employer] that Prior Authorization for TRULICITY  4.5MG /0.5ML AUTO-INEJECTORS  has been APPROVED from 09/19/2023 to 09/19/2024. Unable to obtain price due to refill too soon rejection, last fill date 08/30/2023 next available fill date07/25/2025.   PA #/Case ID/Reference #: [id number]

## 2023-09-23 ENCOUNTER — Other Ambulatory Visit: Payer: Self-pay

## 2023-09-23 ENCOUNTER — Ambulatory Visit
Admission: EM | Admit: 2023-09-23 | Discharge: 2023-09-23 | Disposition: A | Discharge status: Home / Self Care | Attending: Family Medicine | Admitting: Family Medicine

## 2023-09-23 DIAGNOSIS — R220 Localized swelling, mass and lump, head: Secondary | ICD-10-CM | POA: Diagnosis not present

## 2023-09-23 DIAGNOSIS — K047 Periapical abscess without sinus: Secondary | ICD-10-CM

## 2023-09-23 MED ORDER — AMOXICILLIN-POT CLAVULANATE 875-125 MG PO TABS
1.0000 | ORAL_TABLET | Freq: Two times a day (BID) | ORAL | 0 refills | Status: AC
Start: 2023-09-23 — End: 2023-10-03

## 2023-09-23 MED ORDER — CEFTRIAXONE SODIUM 1 G IJ SOLR
1000.0000 mg | Freq: Once | INTRAMUSCULAR | Status: AC
  Administered 2023-09-23: 1000 mg via INTRAMUSCULAR

## 2023-09-23 MED ORDER — PREDNISONE 20 MG PO TABS: ORAL_TABLET | ORAL | 0 refills | Status: DC

## 2023-09-23 NOTE — ED Triage Notes (Signed)
 [facility] presents to [facility] with [facility] pain on left side and facial swelling since Thursday. Pt has has been using otc motrin.

## 2023-09-23 NOTE — Discharge Instructions (Addendum)
 Advised patient to take medication as directed with food to completion.  Advised patient to take prednisone  with first dose of Augmentin  for the next 5 of 10 days.  Encouraged increase daily water intake to 64 ounces per day.  Advised patient to follow-up with dentist first thing Monday, 09/24/2023.

## 2023-09-23 NOTE — ED Provider Notes (Signed)
 [facility]    CSN: [id number] Arrival date & time: 09/23/23  1008      History   Chief Complaint Chief Complaint  Patient presents with   Oral Swelling    Left side facial swelling     HPI Jon Archer is a 63 y.o. male.   HPI 63 year old male presents with dental pain possible dental abscess.  Patient reports left facial swelling and dental pain since Thursday, 09/16/2023.  PMH significant for morbid obesity, T2DM without complication, HTN, and HLD.  Past Medical History:  Diagnosis Date   Carpal tunnel syndrome, bilateral    Diabetes mellitus without complication (HCC)    Hyperlipidemia    Hypertension     Patient Active Problem List   Diagnosis Date Noted   Type 2 diabetes mellitus with obesity (HCC) 08/01/2019   Essential hypertension, benign 11/24/2015   Controlled type 2 diabetes mellitus without complication, without long-term current use of insulin (HCC) 11/24/2015   Bilateral carpal tunnel syndrome 11/24/2015   Morbid obesity (HCC) 11/24/2015    Past Surgical History:  Procedure Laterality Date   TONSILLECTOMY         Home Medications    Prior to Admission medications   Medication Sig Start Date End Date Taking? Authorizing Provider  amoxicillin -clavulanate (AUGMENTIN ) 875-125 MG tablet Take 1 tablet by mouth 2 (two) times daily for 10 days. 09/23/23 10/03/23 Yes Jon Ozell, FNP  predniSONE  (DELTASONE ) 20 MG tablet Take 3 tabs PO daily x 5 days. 09/23/23  Yes Jon Ozell, FNP  aspirin 81 MG tablet Take 81 mg by mouth daily.    [provider]  Blood Glucose Monitoring Suppl DEVI 1 each by Does not apply route in the morning, at noon, and at bedtime. May substitute to any manufacturer covered by patient's insurance. 08/03/23   Frann Mabel Mt, DO  CINNAMON PO Take 2 tablets by mouth 2 (two) times daily.    [provider]  Dulaglutide  (TRULICITY ) 4.5 MG/0.5ML SOAJ Inject 4.5 mg as directed once a week. 07/27/23    Frann Mabel Mt, DO  FARXIGA  10 MG TABS tablet Take 1 tablet (10 mg total) by mouth daily before breakfast. 12/22/22   Frann, Mabel Mt, DO  fluticasone (FLONASE) 50 MCG/ACT nasal spray Place 1 spray into both nostrils 2 (two) times daily.    [provider]  glimepiride  (AMARYL ) 4 MG tablet TAKE 1 TABLET BY MOUTH THREE TIMES DAILY 09/17/23   Frann Mabel Mt, DO  GLUCOMANNAN PO Take 2 tablets by mouth 2 (two) times daily.    [provider]  ipratropium (ATROVENT ) 0.03 % nasal spray Place 2 sprays into both nostrils every 12 (twelve) hours. 03/07/23   Frann Mabel Mt, DO  levocetirizine (XYZAL ) 5 MG tablet Take 1 tablet (5 mg total) by mouth every evening. 08/02/23   Frann Mabel Mt, DO  metFORMIN  (GLUCOPHAGE -XR) 500 MG 24 hr tablet Take 2 tablets (1,000 mg total) by mouth 2 (two) times daily with a meal. 09/03/23   Wendling, Mabel Mt, DO  olmesartan  (BENICAR ) 20 MG tablet Take 1 tablet (20 mg total) by mouth daily. 08/02/23   Frann Mabel Mt, DO  pioglitazone  (ACTOS ) 45 MG tablet Take 1 tablet (45 mg total) by mouth daily. 07/12/23   Frann Mabel Mt, DO  Sodium Hyaluronate, oral, (HYALURONIC ACID PO) Take 1 capsule by mouth in the morning and at bedtime. 07/28/13   [provider]    Family History Family History  Problem Relation Age  of Onset   Heart disease Father     Social History Social History   Tobacco Use   Smoking status: Never   Smokeless tobacco: Never  Substance Use Topics   Alcohol use: No   Drug use: No     Allergies   Peanuts [peanut oil] and Meloxicam   Review of Systems Review of Systems  HENT:  Positive for dental problem.   All other systems reviewed and are negative.    Physical Exam Triage Vital Signs ED Triage Vitals  Encounter Vitals Group     BP      Girls Systolic BP Percentile      Girls Diastolic BP Percentile      Boys Systolic BP Percentile      Boys Diastolic  BP Percentile      Pulse      Resp      Temp      Temp src      SpO2      Weight      Height      Head Circumference      Peak Flow      Pain Score      Pain Loc      Pain Education      Exclude from Growth Chart    No data found.  Updated Vital Signs BP (!) 169/92   Pulse 75   Temp 98.7 F (37.1 C)   Resp 19   SpO2 98%    Physical Exam Vitals and nursing note reviewed.  Constitutional:      Appearance: Normal appearance. He is obese. He is ill-appearing.  HENT:     Head: Normocephalic and atraumatic.     Comments: Significant left sided facial swelling noted please see image below    Mouth/Throat:     Mouth: Mucous membranes are moist.     Pharynx: Oropharynx is clear.     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     Conjunctiva/sclera: Conjunctivae normal.     Pupils: Pupils are equal, round, and reactive to light.  Cardiovascular:     Rate and Rhythm: Normal rate and regular rhythm.     Pulses: Normal pulses.     Heart sounds: Normal heart sounds.  Pulmonary:     Effort: Pulmonary effort is normal.     Breath sounds: Normal breath sounds. No wheezing, rhonchi or rales.  Musculoskeletal:        General: Normal range of motion.     Cervical back: Normal range of motion and neck supple.  Skin:    General: Skin is warm and dry.  Neurological:     General: No focal deficit present.     Mental Status: He is alert and oriented to person, place, and time. Mental status is at baseline.  Psychiatric:        Mood and Affect: Mood normal.        Behavior: Behavior normal.      UC Treatments / Results  Labs (all labs ordered are listed, but only abnormal results are displayed) Labs Reviewed  - No data to display  EKG   Radiology No results found.  Procedures Procedures (including critical care time)  Medications Ordered in UC Medications  cefTRIAXone  (ROCEPHIN ) injection  1,000 mg (1,000 mg Intramuscular Given 09/23/23 1102)    Initial Impression / Assessment and Plan / UC Course  I have reviewed the triage vital signs and the nursing notes.  Pertinent labs & imaging results that were available during my care of the patient were reviewed by me and considered in my medical decision making (see chart for details).     MDM: 1.  Dental abscess-IM Rocephin  1 gram given once in clinic, Rx'd Augmentin  875/125 mg tablet: Take 1 tablet twice daily x 10 days; 2.  Left facial swelling-Rx'd prednisone  20 mg tablet: Take 3 tablets p.o. daily x 5 days. Advised patient to take medication as directed with food to completion.  Advised patient to take prednisone  with first dose of Augmentin  for the next 5 of 10 days.  Encouraged increase daily water intake to 64 ounces per day.  Advised patient to follow-up with dentist first thing Monday, 09/24/2023.  Patient discharged home, hemodynamically stable. Final Clinical Impressions(s) / UC Diagnoses   Final diagnoses:  Dental abscess  Left facial swelling     Discharge Instructions      Advised patient to take medication as directed with food to completion.  Advised patient to take prednisone  with first dose of Augmentin  for the next 5 of 10 days.  Encouraged increase daily water intake to 64 ounces per day.  Advised patient to follow-up with dentist first thing Monday, 09/24/2023.     ED Prescriptions     Medication Sig Dispense Auth. Provider   predniSONE  (DELTASONE ) 20 MG tablet Take 3 tabs PO daily x 5 days. 15 tablet Rayshad Riviello, FNP   amoxicillin -clavulanate (AUGMENTIN ) 875-125 MG tablet Take 1 tablet by mouth 2 (two) times daily for 10 days. 20 tablet Reed Dady, FNP      PDMP not reviewed this encounter.   Elishah, Ashmore, FNP 09/23/23 1119

## 2023-10-04 ENCOUNTER — Other Ambulatory Visit: Payer: Self-pay | Admitting: Family Medicine

## 2023-10-04 DIAGNOSIS — E669 Obesity, unspecified: Secondary | ICD-10-CM

## 2023-10-30 ENCOUNTER — Other Ambulatory Visit: Payer: Self-pay

## 2023-10-30 ENCOUNTER — Ambulatory Visit: Payer: Self-pay | Admitting: Family Medicine

## 2023-10-30 ENCOUNTER — Encounter: Payer: Self-pay | Admitting: Family Medicine

## 2023-10-30 ENCOUNTER — Ambulatory Visit: Admitting: Family Medicine

## 2023-10-30 VITALS — BP 126/78 | HR 75 | Temp 98.4°F | Resp 16 | Ht 69.0 in | Wt 252.6 lb

## 2023-10-30 DIAGNOSIS — Z7984 Long term (current) use of oral hypoglycemic drugs: Secondary | ICD-10-CM | POA: Diagnosis not present

## 2023-10-30 DIAGNOSIS — E1169 Type 2 diabetes mellitus with other specified complication: Secondary | ICD-10-CM | POA: Diagnosis not present

## 2023-10-30 DIAGNOSIS — Z6837 Body mass index (BMI) 37.0-37.9, adult: Secondary | ICD-10-CM

## 2023-10-30 DIAGNOSIS — E669 Obesity, unspecified: Secondary | ICD-10-CM

## 2023-10-30 DIAGNOSIS — I10 Essential (primary) hypertension: Secondary | ICD-10-CM

## 2023-10-30 LAB — LIPID PANEL
Cholesterol: 213 mg/dL — ABNORMAL HIGH (ref 0–200)
HDL: 45.1 mg/dL (ref >39.00)
LDL Cholesterol: 142 mg/dL — ABNORMAL HIGH (ref 0–99)
NonHDL: 168.38
Total CHOL/HDL Ratio: 5
Triglycerides: 134 mg/dL (ref 0.0–149.0)
VLDL: 26.8 mg/dL (ref 0.0–40.0)

## 2023-10-30 LAB — COMPREHENSIVE METABOLIC PANEL WITH GFR
ALT: 23 U/L (ref 0–53)
AST: 20 U/L (ref 0–37)
Albumin: 4.2 g/dL (ref 3.5–5.2)
Alkaline Phosphatase: 91 U/L (ref 39–117)
BUN: 21 mg/dL (ref 6–23)
CO2: 30 meq/L (ref 19–32)
Calcium: 9.1 mg/dL (ref 8.4–10.5)
Chloride: 103 meq/L (ref 96–112)
Creatinine, Ser: 0.89 mg/dL (ref 0.40–1.50)
GFR: 91.49 mL/min (ref >60.00)
Glucose, Bld: 126 mg/dL — ABNORMAL HIGH (ref 70–99)
Potassium: 4.6 meq/L (ref 3.5–5.1)
Sodium: 142 meq/L (ref 135–145)
Total Bilirubin: 0.4 mg/dL (ref 0.2–1.2)
Total Protein: 6.4 g/dL (ref 6.0–8.3)

## 2023-10-30 LAB — MICROALBUMIN / CREATININE URINE RATIO
Creatinine,U: 49.7 mg/dL
Microalb Creat Ratio: 21.9 mg/g (ref 0.0–30.0)
Microalb, Ur: 1.1 mg/dL (ref 0.0–1.9)

## 2023-10-30 LAB — HEMOGLOBIN A1C: Hgb A1c MFr Bld: 7.6 % — ABNORMAL HIGH (ref 4.6–6.5)

## 2023-10-30 NOTE — Patient Instructions (Addendum)
 Take Metamucil or Benefiber daily.  Give us  2-3 business days to get the results of your labs back.   Keep the diet clean and stay active.  Artificial tears like Refresh and Systane may be used for comfort. OK to get generic version. Generally people use them every 2-4 hours, but you can use them as much as you want because there is no medication in it.  [facility] - [facility] [address] [place], [place] [address] [phone]  Send me a message if you wish to see physical therapy.   Let us  know if you need anything.

## 2023-10-30 NOTE — Progress Notes (Signed)
 Subjective:   Chief Complaint  Patient presents with   Follow-up    Follow Up    Jon Archer is a 63 y.o. male here for follow-up of diabetes.   Jon Archer's self monitored glucose range is low 100's.  Patient denies hypoglycemic reactions. He checks his glucose levels intermittently.  Patient does not require insulin.   Medications include: Trulicity  4.5 mg weekly, Farxiga  10 mg daily, metformin  XR 1000 mg twice daily, Actos  45 mg daily, Amaryl  4 mg 3 times daily. Diet is OK.  Exercise: none  Hypertension Patient presents for hypertension follow up. He does not monitor home blood pressures. He is compliant with medication-Benicar  20 mg daily Patient has these side effects of medication: none Diet/exercise as above.  No Cp or SOB.   Past Medical History:  Diagnosis Date   Carpal tunnel syndrome, bilateral    Diabetes mellitus without complication (HCC)    Hyperlipidemia    Hypertension      Related testing: Retinal exam: Due Pneumovax: due  Objective:  BP 126/78 (BP Location: Left Arm, Patient Position: Sitting)   Pulse 75   Temp 98.4 F (36.9 C) (Oral)   Resp 16   Ht 5' 9 (1.753 m)   Wt 252 lb 9.6 oz (114.6 kg)   SpO2 99%   BMI 37.30 kg/m  General:  Well developed, well nourished, in no apparent distress Skin:  Warm, no pallor or diaphoresis on exposed skin Lungs:  CTAB, no access msc use Cardio:  RRR, no bruits, 2+ pitting bilateral lower extremity edema tapering at the mid tibia Psych: Age appropriate judgment and insight  Assessment:   Type 2 diabetes mellitus with obesity (HCC) - Plan: Comprehensive metabolic panel with GFR, Lipid panel, Hemoglobin A1c, Microalbumin / creatinine urine ratio  Essential hypertension, benign   Plan:   Chronic, hopefully stable.  Continue metformin  XR 1000 mg twice daily, Amaryl  4 mg 3 times daily, Trulicity  4.5 mg weekly, Farxiga  10 mg daily, Actos  45 mg daily.  Counseled on diet and exercise.  Ophthalmology  information provided today. Chronic, stable.  Continue Benicar  20 mg daily. F/u in 6 mo. The patient voiced understanding and agreement to the plan.  Mabel Mt [place], DO 10/30/23 8:15 AM

## 2023-11-26 ENCOUNTER — Other Ambulatory Visit: Payer: Self-pay | Admitting: Family Medicine

## 2023-11-26 DIAGNOSIS — E1169 Type 2 diabetes mellitus with other specified complication: Secondary | ICD-10-CM

## 2023-12-10 ENCOUNTER — Other Ambulatory Visit: Payer: Self-pay | Admitting: Family Medicine

## 2023-12-10 DIAGNOSIS — E669 Obesity, unspecified: Secondary | ICD-10-CM

## 2024-01-02 ENCOUNTER — Other Ambulatory Visit: Payer: Self-pay | Admitting: Family Medicine

## 2024-01-02 DIAGNOSIS — E669 Obesity, unspecified: Secondary | ICD-10-CM

## 2024-01-22 ENCOUNTER — Other Ambulatory Visit: Payer: Self-pay | Admitting: Family Medicine

## 2024-01-22 DIAGNOSIS — J302 Other seasonal allergic rhinitis: Secondary | ICD-10-CM

## 2024-01-29 ENCOUNTER — Other Ambulatory Visit

## 2024-01-29 ENCOUNTER — Ambulatory Visit: Payer: Self-pay | Admitting: Family Medicine

## 2024-01-29 DIAGNOSIS — E119 Type 2 diabetes mellitus without complications: Secondary | ICD-10-CM | POA: Diagnosis not present

## 2024-01-29 DIAGNOSIS — E669 Obesity, unspecified: Secondary | ICD-10-CM | POA: Diagnosis not present

## 2024-01-29 LAB — HEMOGLOBIN A1C: Hgb A1c MFr Bld: 7 % — ABNORMAL HIGH (ref 4.6–6.5)

## 2024-02-10 ENCOUNTER — Other Ambulatory Visit: Payer: Self-pay | Admitting: Family Medicine

## 2024-02-20 ENCOUNTER — Other Ambulatory Visit: Payer: Self-pay | Admitting: Family Medicine

## 2024-02-20 DIAGNOSIS — E669 Obesity, unspecified: Secondary | ICD-10-CM

## 2024-02-26 ENCOUNTER — Other Ambulatory Visit: Payer: Self-pay | Admitting: Family Medicine

## 2024-03-09 ENCOUNTER — Other Ambulatory Visit: Payer: Self-pay | Admitting: Family Medicine

## 2024-03-27 ENCOUNTER — Other Ambulatory Visit: Payer: Self-pay | Admitting: Family Medicine

## 2024-03-27 DIAGNOSIS — E669 Obesity, unspecified: Secondary | ICD-10-CM

## 2024-04-01 ENCOUNTER — Other Ambulatory Visit: Payer: Self-pay | Admitting: Family Medicine

## 2024-04-28 ENCOUNTER — Ambulatory Visit: Admitting: Family Medicine
